# Patient Record
Sex: Female | Born: 1990
Health system: Southern US, Community
[De-identification: ages and names within clinical notes are randomized; demographics above are authoritative.]

## PROBLEM LIST (undated history)

## (undated) DIAGNOSIS — F3281 Premenstrual dysphoric disorder: Secondary | ICD-10-CM

## (undated) DIAGNOSIS — L732 Hidradenitis suppurativa: Secondary | ICD-10-CM

## (undated) DIAGNOSIS — G4489 Other headache syndrome: Secondary | ICD-10-CM

## (undated) HISTORY — PX: PILONIDAL CYST EXCISION: SHX744

---

## 2015-02-12 ENCOUNTER — Encounter (HOSPITAL_COMMUNITY): Payer: Self-pay | Admitting: Emergency Medicine

## 2015-02-12 ENCOUNTER — Emergency Department (HOSPITAL_COMMUNITY)
Admission: EM | Admit: 2015-02-12 | Discharge: 2015-02-12 | Disposition: A | Discharge status: Home / Self Care | Payer: Self-pay | Attending: Emergency Medicine | Admitting: Emergency Medicine

## 2015-02-12 DIAGNOSIS — Z720 Tobacco use: Secondary | ICD-10-CM | POA: Insufficient documentation

## 2015-02-12 DIAGNOSIS — J029 Acute pharyngitis, unspecified: Secondary | ICD-10-CM | POA: Insufficient documentation

## 2015-02-12 LAB — RAPID STREP SCREEN (MED CTR MEBANE ONLY): Streptococcus, Group A Screen (Direct): NEGATIVE

## 2015-02-12 MED ORDER — DEXAMETHASONE SODIUM PHOSPHATE 10 MG/ML IJ SOLN
10.0000 mg | Freq: Once | INTRAMUSCULAR | Status: AC
  Administered 2015-02-12: 10 mg via INTRAMUSCULAR
  Filled 2015-02-12: qty 1

## 2015-02-12 NOTE — ED Notes (Signed)
Patient states started having sore throat x 3 days ago.   Patient denies other symptoms.

## 2015-02-12 NOTE — ED Notes (Signed)
Pt waiting required observation time required after injection.

## 2015-02-12 NOTE — ED Provider Notes (Signed)
CSN: [id number]     Arrival date & time 02/12/15  1010 History  This chart was scribed for non-physician practitioner Jaynie Crumbleatyana Atsushi Yom, PA-C working with Purvis SheffieldForrest Harrison, MD by Littie Deedsichard Sun, ED Scribe. This patient was seen in room TR07C/TR07C and the patient's care was started at 11:11 AM.       Chief Complaint  Patient presents with  . Sore Throat   The history is provided by the patient. No language interpreter was used.    HPI Comments: Melanie Townsend is a 24 y.o. female who presents to the Emergency Department complaining of gradual onset sore throat that started 2-3 days ago. Patient also reports having had subjective fever, chills, and fatigue, did not check her temp at home; she states these have resolved. She thinks she may have strep throat. She has taken cough drops, saltwater gargles, sore throat spray, and ibuprofen but without relief. Today, she has taken ibuprofen. Patient denies cough and rhinorrhea.    History reviewed. No pertinent past medical history. History reviewed. No pertinent past surgical history. No family history on file. History  Substance Use Topics  . Smoking status: Current Every Day Smoker -- 0.30 packs/day    Types: Cigarettes  . Smokeless tobacco: Not on file  . Alcohol Use: Yes   OB History    No data available     Review of Systems  Constitutional: Positive for fever, chills and fatigue.  HENT: Positive for sore throat. Negative for rhinorrhea.   Respiratory: Negative for cough.       Allergies  Iodine  Home Medications   Prior to Admission medications   Not on File   BP 124/86 mmHg  Pulse 91  Temp(Src) 98.6 F (37 C) (Oral)  Resp 20  Ht 5\' 5"  (1.651 m)  Wt 209 lb (94.802 kg)  BMI 34.78 kg/m2  SpO2 100%  LMP 02/12/2015 Physical Exam  Constitutional: She is oriented to person, place, and time. She appears well-developed and well-nourished. No distress.  HENT:  Head: Normocephalic and atraumatic.  Mouth/Throat: Oropharynx  is clear and moist. No oropharyngeal exudate.  Bilaterally enlarged tonsils, no exudate. Uvula is midline. No trismus.  Eyes: Pupils are equal, round, and reactive to light.  Neck: Neck supple.  Cardiovascular: Normal rate, regular rhythm, normal heart sounds and intact distal pulses.   Pulmonary/Chest: Effort normal and breath sounds normal. No respiratory distress. She has no wheezes. She has no rales.  Musculoskeletal: She exhibits no edema.  Lymphadenopathy:    She has no cervical adenopathy.  Neurological: She is alert and oriented to person, place, and time. No cranial nerve deficit.  Skin: Skin is warm and dry. No rash noted.  Psychiatric: She has a normal mood and affect. Her behavior is normal.  Nursing note and vitals reviewed.   ED Course  Procedures  DIAGNOSTIC STUDIES: Oxygen Saturation is 100% on room air, normal by my interpretation.    COORDINATION OF CARE: 11:14 AM-Discussed treatment plan which includes rapid strep screen and strep culture with patient/guardian at bedside and patient/guardian agreed to plan.    Labs Review Labs Reviewed  RAPID STREP SCREEN (NOT [facility])  CULTURE, GROUP A STREP    Imaging Review No results found.   EKG Interpretation None      MDM   Final diagnoses:  Pharyngitis    patient with bilaterally enlarged and painful tonsils. No exudate. He is afebrile. Nontoxic appearing. No difficulty swallowing. Strep screen is negative. Decadron 10 mg IM for swelling. Home  with Tylenol/Motrin, saltwater gargles, Chloraseptic Spray. Most likely viral pharyngitis. Strep cultures are sent and she will be contacted if they return abnormal. Patient instructed to follow-up with primary care doctor or return if worsening  Filed Vitals:   02/12/15 1017 02/12/15 1127  BP: 124/86 117/80  Pulse: 91 77  Temp: 98.6 F (37 C) 98.7 F (37.1 C)  TempSrc: Oral Oral  Resp: 20 16  Height:  (1.651 m)   Weight: 209 lb (94.802 kg)   SpO2: 100%  100%    I personally performed the services described in this documentation, which was scribed in my presence. The recorded information has been reviewed and is accurate.   Jaynie Crumble, PA-C 02/12/15 1130  Jaynie Crumble, PA-C 02/12/15 1130  Purvis Sheffield, MD 02/12/15 1540

## 2015-02-12 NOTE — Discharge Instructions (Signed)
Your strep screen is negative. We sent your swab for culture. If it comes back positive we will call you. Meanwhile take Tylenol or Motrin for pain. Saltwater gargles multiple times a day. Try Chloraseptic throat spray or lozenges. Follow-up as needed, return if worsening.  Pharyngitis Pharyngitis is redness, pain, and swelling (inflammation) of your pharynx.  CAUSES  Pharyngitis is usually caused by infection. Most of the time, these infections are from viruses (viral) and are part of a cold. However, sometimes pharyngitis is caused by bacteria (bacterial). Pharyngitis can also be caused by allergies. Viral pharyngitis may be spread from person to person by coughing, sneezing, and personal items or utensils (cups, forks, spoons, toothbrushes). Bacterial pharyngitis may be spread from person to person by more intimate contact, such as kissing.  SIGNS AND SYMPTOMS  Symptoms of pharyngitis include:   Sore throat.   Tiredness (fatigue).   Low-grade fever.   Headache.  Joint pain and muscle aches.  Skin rashes.  Swollen lymph nodes.  Plaque-like film on throat or tonsils (often seen with bacterial pharyngitis). DIAGNOSIS  Your health care provider will ask you questions about your illness and your symptoms. Your medical history, along with a physical exam, is often all that is needed to diagnose pharyngitis. Sometimes, a rapid strep test is done. Other lab tests may also be done, depending on the suspected cause.  TREATMENT  Viral pharyngitis will usually get better in 3-4 days without the use of medicine. Bacterial pharyngitis is treated with medicines that kill germs (antibiotics).  HOME CARE INSTRUCTIONS   Drink enough water and fluids to keep your urine clear or pale yellow.   Only take over-the-counter or prescription medicines as directed by your health care provider:   If you are prescribed antibiotics, make sure you finish them even if you start to feel better.   Do not  take aspirin.   Get lots of rest.   Gargle with 8 oz of salt water ( tsp of salt per 1 qt of water) as often as every 1-2 hours to soothe your throat.   Throat lozenges (if you are not at risk for choking) or sprays may be used to soothe your throat. SEEK MEDICAL CARE IF:   You have large, tender lumps in your neck.  You have a rash.  You cough up green, yellow-brown, or bloody spit. SEEK IMMEDIATE MEDICAL CARE IF:   Your neck becomes stiff.  You drool or are unable to swallow liquids.  You vomit or are unable to keep medicines or liquids down.  You have severe pain that does not go away with the use of recommended medicines.  You have trouble breathing (not caused by a stuffy nose). MAKE SURE YOU:   Understand these instructions.  Will watch your condition.  Will get help right away if you are not doing well or get worse. Document Released: 08/01/2005 Document Revised: 05/22/2013 Document Reviewed: 04/08/2013 [facility] Patient Information 2015 [employer] [place]. This information is not intended to replace advice given to you by your health care provider. Make sure you discuss any questions you have with your health care provider.

## 2015-02-14 LAB — CULTURE, GROUP A STREP

## 2015-10-21 ENCOUNTER — Encounter (HOSPITAL_COMMUNITY): Payer: Self-pay | Admitting: *Deleted

## 2015-10-21 ENCOUNTER — Emergency Department (HOSPITAL_COMMUNITY)
Admission: EM | Admit: 2015-10-21 | Discharge: 2015-10-21 | Disposition: A | Discharge status: Home / Self Care | Payer: Self-pay | Attending: Emergency Medicine | Admitting: Emergency Medicine

## 2015-10-21 DIAGNOSIS — F17210 Nicotine dependence, cigarettes, uncomplicated: Secondary | ICD-10-CM | POA: Insufficient documentation

## 2015-10-21 DIAGNOSIS — L03111 Cellulitis of right axilla: Secondary | ICD-10-CM | POA: Insufficient documentation

## 2015-10-21 DIAGNOSIS — L539 Erythematous condition, unspecified: Secondary | ICD-10-CM | POA: Insufficient documentation

## 2015-10-21 MED ORDER — CLINDAMYCIN HCL 150 MG PO CAPS: 450.0000 mg | ORAL_CAPSULE | Freq: Three times a day (TID) | ORAL | Status: DC

## 2015-10-21 NOTE — ED Provider Notes (Signed)
CSN: [id number]     Arrival date & time 10/21/15  1500 History   By signing my name below, I, Iona Beard, attest that this documentation has been prepared under the direction and in the presence of Cheri Fowler, PA-C. Electronically Signed: Iona Beard, ED Scribe 10/21/2015 at 4:32 PM.    Chief Complaint  Patient presents with  . Recurrent Skin Infections    The history is provided by the patient. No language interpreter was used.   HPI Comments: Melanie Townsend is a 25 y.o. female who presents to the Emergency Department complaining of gradual onset, constant boil located in her right underarm, onset about a week ago. Pt states that she has had boils in the area before but they usually alleviate quickly. Pt has used warm compress and OTC boil ointment at home with no relief to symptoms. No other worsening or alleviating factors noted. Pt denies purulent drainage, fever, chills, nausea, vomiting, or any other pertinent symptoms.   History reviewed. No pertinent past medical history. History reviewed. No pertinent past surgical history. No family history on file. Social History  Substance Use Topics  . Smoking status: Current Every Day Smoker -- 0.30 packs/day    Types: Cigarettes  . Smokeless tobacco: None  . Alcohol Use: Yes   OB History    No data available     Review of Systems  Constitutional: Negative for fever and chills.  Gastrointestinal: Negative for nausea and vomiting.  Skin:       Boil noted in right underarm area.   All other systems reviewed and are negative.    Allergies  Iodine  Home Medications   Prior to Admission medications   Medication Sig Start Date End Date Taking? Authorizing Provider  clindamycin (CLEOCIN) 150 MG capsule Take 3 capsules (450 mg total) by mouth 3 (three) times daily. 10/21/15   Madia Carvell, PA-C   BP 119/76 mmHg  Pulse 85  Temp(Src) 98.1 F (36.7 C) (Oral)  Resp 16  SpO2 100%  LMP 09/26/2015 Physical Exam   Constitutional: She is oriented to person, place, and time. She appears well-developed and well-nourished.  HENT:  Head: Normocephalic and atraumatic.  Right Ear: External ear normal.  Left Ear: External ear normal.  Eyes: Conjunctivae are normal. No scleral icterus.  Neck: No tracheal deviation present.  Pulmonary/Chest: Effort normal. No respiratory distress.  Abdominal: She exhibits no distension.  Musculoskeletal: Normal range of motion.  Neurological: She is alert and oriented to person, place, and time.  Skin: Skin is warm and dry.  1x1 cm area of induration with overlying erythema along sinus tract of right axilla.  No fluctuance.  No drainage.  Psychiatric: She has a normal mood and affect. Her behavior is normal.    ED Course  Procedures (including critical care time) DIAGNOSTIC STUDIES: Oxygen Saturation is 100% on RA, normal by my interpretation.    COORDINATION OF CARE: 4:31 PM-Discussed treatment plan which includes symptom monitoring, ibuprofen for pain, and clindamycin with pt at bedside and pt agreed to plan.    Labs Review Labs Reviewed - No data to display  Imaging Review No results found.    EKG Interpretation None      MDM   Final diagnoses:  Cellulitis of right axilla   VSS, NAD.  No systemic symptoms.  Findings consistent with cellulitis.  No indication for I&D. Plan to discharge home with clindamycin.  Recommend tylenol or ibuprofen for pain.  Continue warm compresses. Patient given general surgery follow  up for possible removal of recurrent sinus tract infections.  Discussed return precautions including increase in size, warmth, spreading erythema, fever, chills, nausea, or vomiting.  Patient agrees and acknowledges the above plan for discharge.   I personally performed the services described in this documentation, which was scribed in my presence. The recorded information has been reviewed and is accurate.      Cheri FowlerKayla Bethannie Iglehart, PA-C 10/21/15  1639  Zadie Rhineonald Wickline, MD 10/22/15 [phone]

## 2015-10-21 NOTE — ED Notes (Signed)
Pt reports boil under rt arm. States that it has been there for several days. Pt reports hx of same.

## 2015-10-21 NOTE — ED Notes (Signed)
Patient able to ambulate independently  

## 2015-10-21 NOTE — Discharge Instructions (Signed)

## 2015-10-21 NOTE — ED Notes (Signed)
Patient able to ambulate independently to room 

## 2016-05-25 ENCOUNTER — Emergency Department (HOSPITAL_COMMUNITY)
Admission: EM | Admit: 2016-05-25 | Discharge: 2016-05-25 | Disposition: A | Discharge status: Home / Self Care | Payer: Self-pay | Attending: Emergency Medicine | Admitting: Emergency Medicine

## 2016-05-25 ENCOUNTER — Encounter (HOSPITAL_COMMUNITY): Payer: Self-pay | Admitting: *Deleted

## 2016-05-25 DIAGNOSIS — L0291 Cutaneous abscess, unspecified: Secondary | ICD-10-CM

## 2016-05-25 DIAGNOSIS — F17210 Nicotine dependence, cigarettes, uncomplicated: Secondary | ICD-10-CM | POA: Insufficient documentation

## 2016-05-25 DIAGNOSIS — L02411 Cutaneous abscess of right axilla: Secondary | ICD-10-CM | POA: Insufficient documentation

## 2016-05-25 DIAGNOSIS — L02412 Cutaneous abscess of left axilla: Secondary | ICD-10-CM | POA: Insufficient documentation

## 2016-05-25 MED ORDER — DOXYCYCLINE HYCLATE 100 MG PO CAPS: 100.0000 mg | ORAL_CAPSULE | Freq: Two times a day (BID) | ORAL | 0 refills | Status: DC

## 2016-05-25 MED ORDER — LIDOCAINE HCL (PF) 1 % IJ SOLN
30.0000 mL | Freq: Once | INTRAMUSCULAR | Status: AC
  Administered 2016-05-25: 30 mL via INTRADERMAL
  Filled 2016-05-25: qty 30

## 2016-05-25 NOTE — ED Provider Notes (Signed)
[facility]-EMERGENCY DEPT Provider Note   CSN: [id number] Arrival date & time: 05/25/16  1036  By signing my name below, I, Freida Busman, attest that this documentation has been prepared under the direction and in the presence of non-physician practitioner, Teressa Lower, NP. Electronically Signed: Freida Busman, Scribe. 05/25/2016. 10:58 AM.   History   Chief Complaint Chief Complaint  Patient presents with  . Abscess    The history is provided by the patient. No language interpreter was used.    HPI Comments:  Melanie Townsend is a 25 y.o. female who presents to the Emergency Department complaining of painful boils to her bilateral axilla x ~ 1 week. She notes she has a h/o same and states they are usually resolved with warm compresses. Pt admits she uses razors to shave her underarms. No alleviating factors noted. Pt has no other complaints or symptoms at this time.    History reviewed. No pertinent past medical history.  There are no active problems to display for this patient.   History reviewed. No pertinent surgical history.  OB History    No data available       Home Medications    Prior to Admission medications   Medication Sig Start Date End Date Taking? Authorizing Provider  clindamycin (CLEOCIN) 150 MG capsule Take 3 capsules (450 mg total) by mouth 3 (three) times daily. 10/21/15   Cheri Fowler, PA-C    Family History History reviewed. No pertinent family history.  Social History Social History  Substance Use Topics  . Smoking status: Current Every Day Smoker    Packs/day: 0.30    Types: Cigarettes  . Smokeless tobacco: Not on file  . Alcohol use Yes     Allergies   Iodine   Review of Systems Review of Systems  Constitutional: Negative for chills and fever.  Skin: Positive for rash.    Physical Exam Updated Vital Signs BP 116/83 (BP Location: Left Arm)   Pulse 94   Temp 98.3 F (36.8 C) (Oral)   Resp 21   Ht 5\' 6"  (1.676 m)   Wt 231 lb  6 oz (105 kg)   LMP 05/15/2016   SpO2 100%   BMI 37.34 kg/m   Physical Exam  Constitutional: She is oriented to person, place, and time. She appears well-developed and well-nourished. No distress.  HENT:  Head: Normocephalic and atraumatic.  Eyes: Conjunctivae are normal.  Cardiovascular: Normal rate.   Pulmonary/Chest: Effort normal.  Abdominal: She exhibits no distension.  Neurological: She is alert and oriented to person, place, and time.  Skin: Skin is warm and dry.  fluctuant erythematous area to the left axilla. No drainage noted. Pt tender in the right axilla. No redness or warmth noted  Psychiatric: She has a normal mood and affect.  Nursing note and vitals reviewed.    ED Treatments / Results  DIAGNOSTIC STUDIES:  Oxygen Saturation is 100% on RA, normal by my interpretation.    COORDINATION OF CARE:  10:57 AM Discussed treatment plan with pt at bedside and pt agreed to plan.  Labs (all labs ordered are listed, but only abnormal results are displayed) Labs Reviewed - No data to display  EKG  EKG Interpretation None       Radiology No results found.  Procedures Procedures (including critical care time)  INCISION AND DRAINAGE PROCEDURE NOTE: Patient identification was confirmed and verbal consent was obtained. This procedure was performed by Teressa Lower, NP at 10:57 AM. Site: right axila Sterile procedures observed  Needle size: 25 gauge  Anesthetic used (type and amt): lido with epi Blade size: 11 Drainage: scant Complexity: Complex Site anesthetized, incision made over site, wound drained and explored loculations, rinsed with copious amounts of normal saline, wound packed with sterile gauze, covered with dry, sterile dressing.  Pt tolerated procedure well without complications.  Instructions for care discussed verbally and pt provided with additional written instructions for homecare and f/u.   Medications Ordered in ED Medications    lidocaine (PF) (XYLOCAINE) 1 % injection 30 mL (30 mLs Intradermal Given by Other 05/25/16 1057)     Initial Impression / Assessment and Plan / ED Course  I have reviewed the triage vital signs and the nursing notes.  Pertinent labs & imaging results that were available during my care of the patient were reviewed by me and considered in my medical decision making (see chart for details).  Clinical Course   Area drained. Will treat with antibiotics   Patient with skin abscess. Incision and drainage performed in the ED today.  Abscess was not large enough to warrant packing or drain placement. Wound recheck in 2 days. Supportive care and return precautions discussed.  Pt sent home with antibiotics. The patient appears reasonably screened and/or stabilized for discharge and I doubt any other emergent medical condition requiring further screening, evaluation, or treatment in the ED prior to discharge.   Final Clinical Impressions(s) / ED Diagnoses   Final diagnoses:  None    New Prescriptions New Prescriptions   No medications on file   I personally performed the services described in this documentation, which was scribed in my presence. The recorded information has been reviewed and is accurate.     Teressa LowerVrinda Dameir Gentzler, NP 05/25/16 1136    Shaune Pollackameron Isaacs, MD 05/25/16 1755

## 2016-05-25 NOTE — ED Notes (Signed)
C/O BILATERAL AXILLARY ABSCESSES. HX OF RECURRING ABSCESSES.

## 2016-05-25 NOTE — ED Triage Notes (Signed)
Pt has abscess under bilateral arms, denies fever. No acute distress noted at triage.

## 2016-11-15 ENCOUNTER — Emergency Department (HOSPITAL_COMMUNITY)
Admission: EM | Admit: 2016-11-15 | Discharge: 2016-11-15 | Disposition: A | Discharge status: Home / Self Care | Payer: Self-pay | Attending: Emergency Medicine | Admitting: Emergency Medicine

## 2016-11-15 ENCOUNTER — Encounter (HOSPITAL_COMMUNITY): Payer: Self-pay

## 2016-11-15 DIAGNOSIS — F17210 Nicotine dependence, cigarettes, uncomplicated: Secondary | ICD-10-CM | POA: Insufficient documentation

## 2016-11-15 DIAGNOSIS — L0291 Cutaneous abscess, unspecified: Secondary | ICD-10-CM

## 2016-11-15 DIAGNOSIS — L02412 Cutaneous abscess of left axilla: Secondary | ICD-10-CM | POA: Insufficient documentation

## 2016-11-15 MED ORDER — CEPHALEXIN 500 MG PO CAPS: 500.0000 mg | ORAL_CAPSULE | Freq: Two times a day (BID) | ORAL | 0 refills | Status: DC

## 2016-11-15 MED ORDER — LIDOCAINE-EPINEPHRINE (PF) 2 %-1:200000 IJ SOLN
10.0000 mL | Freq: Once | INTRAMUSCULAR | Status: AC
  Administered 2016-11-15: 10 mL via INTRADERMAL
  Filled 2016-11-15: qty 20

## 2016-11-15 MED ORDER — HYDROCODONE-ACETAMINOPHEN 5-325 MG PO TABS: 1.0000 | ORAL_TABLET | ORAL | 0 refills | Status: DC | PRN

## 2016-11-15 MED ORDER — IBUPROFEN 400 MG PO TABS
600.0000 mg | ORAL_TABLET | Freq: Once | ORAL | Status: AC
  Administered 2016-11-15: 600 mg via ORAL
  Filled 2016-11-15: qty 1

## 2016-11-15 NOTE — Discharge Instructions (Signed)
Take pain medication as directed for symptomatic relief.   After you are done taking the pain medication you can take ibuprofen as needed for symptomatic relief.   If the drain falls out, that is ok.  Apply warm compresses to arm pit throughout the day. Take antibiotic and completion. Follow-up with [facility] in 3 days for wound recheck and packing removal.  Have her return to emergency department for emergent changing or wRedge Gainerg symptoms.  Return to the emergency dept for any worsening redness, swelling, fevers, or any other concerns.    If you do not have a primary care doctor you see regularly, please you the list below. Please call them to arrange for follow-up.    No Primary Care Doctor Call [facility]  [phone] Other agencies that provide inexpensive medical care    [facility]  [phone]    [facility]  [phone]    [facility]  [phone]    [facility]  [phone]    [facility]  [phone]    [facility]  [phone]

## 2016-11-15 NOTE — ED Triage Notes (Signed)
Patient here with abscess to left axilla, history of same, no drainage

## 2016-11-15 NOTE — ED Provider Notes (Signed)
[facility]Y DEPT Provider Note   CSN: [id number] Arrival date & time: 11/15/16  1120  By signing my name below, I, Melanie Townsend, attest that this documentation has been prepared under the direction and in the presence of [name], [place]. Electronically Signed: Teofilo Townsend, ED Scribe. 11/15/2016. 2:14 PM.    History   Chief Complaint No chief complaint on file.  The history is provided by the patient. No language interpreter was used.  HPI Comments: Melanie Townsend is a 26 y.o. female who presents to the Emergency Department complaining of a moderate, gradually worsening area of pain and swelling consistent with an abscess to the left axilla x 2 days. Pt states pain is exacerbated with palpation and direct pressure. She has taken ibuprofen for pain with minimal relief. She reports a history of previous similar abscesses that have had to be previously drained. Pt reports denies drainage from the area. Pt denies fevers, chills, chest pain, SOB, nausea, vomiting.   History reviewed. No pertinent past medical history.  There are no active problems to display for this patient.   History reviewed. No pertinent surgical history.  OB History    No data available       Home Medications    Prior to Admission medications   Medication Sig Start Date End Date Taking? Authorizing Provider  ibuprofen (ADVIL,MOTRIN) 200 MG tablet Take 200-800 mg by mouth every 6 (six) hours as needed for mild pain.   Yes Historical Provider, MD  cephALEXin (KEFLEX) 500 MG capsule Take 1 capsule (500 mg total) by mouth 2 (two) times daily. 11/15/16   Maxwell Caul, PA-C  HYDROcodone-acetaminophen (NORCO/VICODIN) 5-325 MG tablet Take 1 tablet by mouth every 4 (four) hours as needed. 11/15/16   Maxwell Caul, PA-C    Family History No family history on file.  Social History Social History  Substance Use Topics  . Smoking status: Current Every Day Smoker    Packs/day: 0.30    Types:  Cigarettes  . Smokeless tobacco: Not on file  . Alcohol use Yes     Allergies   Iodine   Review of Systems Review of Systems  Constitutional: Negative for fever.  Respiratory: Negative for cough and shortness of breath.   Cardiovascular: Negative for chest pain.  Gastrointestinal: Negative for abdominal pain, constipation, diarrhea, nausea and vomiting.  Genitourinary: Negative for dysuria and hematuria.  Skin:       Positive for abscess.  Neurological: Negative for headaches.  All other systems reviewed and are negative.    Physical Exam Updated Vital Signs BP 107/65 (BP Location: Right Arm)   Pulse 73   Temp 98.8 F (37.1 C)   Resp 18   SpO2 100%   Physical Exam  Constitutional: She appears well-developed and well-nourished.  HENT:  Head: Normocephalic and atraumatic.  Eyes: Conjunctivae and EOM are normal. Right eye exhibits no discharge. Left eye exhibits no discharge. No scleral icterus.  Pulmonary/Chest: Effort normal.  Musculoskeletal: She exhibits no deformity.  Neurological: She is alert.  Skin: Skin is warm and dry.  2cm area of fluctuance on anterior aspect of left axilla. No surrounding erythema, warmth, or tenderness.   Psychiatric: She has a normal mood and affect. Her speech is normal and behavior is normal.     ED Treatments / Results  DIAGNOSTIC STUDIES:  Oxygen Saturation is 100% on RA, normal by my interpretation.    COORDINATION OF CARE:    Labs (all labs ordered are listed, but  only abnormal results are displayed) Labs Reviewed - No data to display  EKG  EKG Interpretation None       Radiology No results found.  Procedures .Marland KitchenIncision and Drainage Date/Time: 11/15/2016 3:11 PM Performed by: [name] A Authorized by: Margarita Grizzle   Consent:    Consent obtained:  Verbal   Consent given by:  Patient Location:    Type:  Abscess   Location:  Upper extremity   Upper extremity location: left axilla. Pre-procedure  details:    Procedure prep: Alcohol due to patients allergy to iodine. Anesthesia (see MAR for exact dosages):    Anesthesia method:  Local infiltration   Local anesthetic:  Lidocaine 2% WITH epi Procedure type:    Complexity:  Simple Procedure details:    Incision types:  Single straight   Scalpel blade:  11   Wound management:  Probed and deloculated   Drainage:  Purulent   Drainage amount:  Moderate   Wound treatment:  Drain placed   Packing materials:  1/4 in iodoform gauze Post-procedure details:    Patient tolerance of procedure:  Tolerated well, no immediate complications      Medications Ordered in ED Medications  lidocaine-EPINEPHrine (XYLOCAINE W/EPI) 2 %-1:200000 (PF) injection 10 mL (10 mLs Intradermal Given 11/15/16 1502)  ibuprofen (ADVIL,MOTRIN) tablet 600 mg (600 mg Oral Given 11/15/16 1501)     Initial Impression / Assessment and Plan / ED Course  I have reviewed the triage vital signs and the nursing notes.  Pertinent labs & imaging results that were available during my care of the patient were reviewed by me and considered in my medical decision making (see chart for details).     26 y.o. F presents with abscess to her left axilla. Has a history of previous abscesses in various locations that have required I&D. Physical exam with a 2cm in diameter abscess to the left axilla with surrounding fluctuance. Plan to I&D. Discussed plan with patient and she is in agreement.   I&D performed with moderate purulent drainage. Wick drain left in place. Discussed proper wound management with patient. Instructed her to return to the ED in 3 days for wound recheck or sooner if she experiences worsening concerns or symptoms. Patient provided a short course of Norco for pain relief and short course of antibiotic. Return precautions discussed. Patient expressed understanding andd agreement to plan.   Final Clinical Impressions(s) / ED Diagnoses   Final diagnoses:  Abscess     New Prescriptions Discharge Medication List as of 11/15/2016  3:29 PM    START taking these medications   Details  cephALEXin (KEFLEX) 500 MG capsule Take 1 capsule (500 mg total) by mouth 2 (two) times daily., Starting Tue 11/15/2016, Print    HYDROcodone-acetaminophen (NORCO/VICODIN) 5-325 MG tablet Take 1 tablet by mouth every 4 (four) hours as needed., Starting Tue 11/15/2016, Print      I personally performed the services described in this documentation, which was scribed in my presence. The recorded information has been reviewed and is accurate.     Maxwell Caul, PA-C 11/15/16 1610    Margarita Grizzle, MD 11/19/16 1726

## 2016-11-19 ENCOUNTER — Encounter (HOSPITAL_COMMUNITY): Payer: Self-pay | Admitting: Emergency Medicine

## 2016-11-19 ENCOUNTER — Emergency Department (HOSPITAL_COMMUNITY)
Admission: EM | Admit: 2016-11-19 | Discharge: 2016-11-19 | Disposition: A | Discharge status: Home / Self Care | Payer: Self-pay | Attending: Emergency Medicine | Admitting: Emergency Medicine

## 2016-11-19 DIAGNOSIS — F17210 Nicotine dependence, cigarettes, uncomplicated: Secondary | ICD-10-CM | POA: Insufficient documentation

## 2016-11-19 DIAGNOSIS — Z4801 Encounter for change or removal of surgical wound dressing: Secondary | ICD-10-CM | POA: Insufficient documentation

## 2016-11-19 DIAGNOSIS — Z09 Encounter for follow-up examination after completed treatment for conditions other than malignant neoplasm: Secondary | ICD-10-CM

## 2016-11-19 NOTE — ED Notes (Signed)
Declined W/C at D/C and was escorted to lobby by RN. 

## 2016-11-19 NOTE — ED Provider Notes (Signed)
[facility] Provider Note   CSN: [id number] Arrival date & time: 11/19/16  6962  By signing my name below, I, Freida Busman, attest that this documentation has been prepared under the direction and in the presence of General Wearing, PA-C. Electronically Signed: Freida Busman, Scribe. 11/19/2016. 10:43 AM.  History   Chief Complaint Chief Complaint  Patient presents with  . Wound Check    The history is provided by the patient and medical records. No language interpreter was used.    HPI Comments:  Melanie Townsend is a 26 y.o. female with a h/o of recurrent abscesses presents to the Emergency Department for a wound check. About one week ago she first noticed a painful boil to the left axilla. She states some go away on their own, some resolve with warm compresses and some require I&D. She also has a h/o pilonidal cyst that required surgical intervention.    Pt was seen in the ED on 11/15/16 for the abscess to the left axilla. An incision and drainage was performed and packing was placed.  She was discharged with Rx for Keflex and Vicodin. Pt has not taken the Keflex because she is currently taking Metronidazole for BV and was concerned about interactions. She also states she has not taken the Vicodin either. Pt was instructed to return in 3 days for a wound check. She reports discomfort to the site but states pain has improved significantly since discharge. She also notes subjective fever yesterday that resolved after taking Tylenol.   History reviewed. No pertinent past medical history.  There are no active problems to display for this patient.   No past surgical history on file.  OB History    No data available       Home Medications    Prior to Admission medications   Medication Sig Start Date End Date Taking? Authorizing Provider  cephALEXin (KEFLEX) 500 MG capsule Take 1 capsule (500 mg total) by mouth 2 (two) times daily. 11/15/16   Maxwell Caul, PA-C    HYDROcodone-acetaminophen (NORCO/VICODIN) 5-325 MG tablet Take 1 tablet by mouth every 4 (four) hours as needed. 11/15/16   Maxwell Caul, PA-C  ibuprofen (ADVIL,MOTRIN) 200 MG tablet Take 200-800 mg by mouth every 6 (six) hours as needed for mild pain.    Historical Provider, MD    Family History No family history on file.  Social History Social History  Substance Use Topics  . Smoking status: Current Every Day Smoker    Packs/day: 0.30    Types: Cigarettes  . Smokeless tobacco: Not on file  . Alcohol use Yes     Allergies   Iodine   Review of Systems Review of Systems  Constitutional: Positive for fever (subjective; resolved). Negative for activity change.  Respiratory: Negative for shortness of breath.   Cardiovascular: Negative for chest pain.  Gastrointestinal: Negative for abdominal pain.  Musculoskeletal: Negative for back pain.  Skin: Positive for wound. Negative for rash.   Physical Exam Updated Vital Signs BP 114/90   Pulse 86   Temp 98.9 F (37.2 C)   Resp 19   LMP 11/07/2016   SpO2 100%   Physical Exam  Constitutional: She appears well-developed and well-nourished. No distress.  HENT:  Head: Normocephalic and atraumatic.  Eyes: Conjunctivae are normal.  Neck: Neck supple.  Cardiovascular: Normal rate, regular rhythm and normal heart sounds.  Exam reveals no gallop and no friction rub.   No murmur heard. Pulmonary/Chest: Effort normal and breath sounds normal.  No respiratory distress. She has no wheezes. She has no rales.  Abdominal: Soft. She exhibits no distension. There is no tenderness. There is no guarding.  Musculoskeletal: She exhibits no edema.  Neurological: She is alert.  Skin: Skin is warm and dry. No rash noted. She is not diaphoretic.  Left axilla: Well-healing abscess without surrounding cellulitis. No active purulent draining from I&D site after packing was removed. Minimal purulent drainage expressed with palpation. No palpable  fluctuance.   Psychiatric: Her behavior is normal.  Nursing note and vitals reviewed.  ED Treatments / Results  DIAGNOSTIC STUDIES:  Oxygen Saturation is 100% on RA, normal by my interpretation.    COORDINATION OF CARE:  10:40 AM Discussed treatment plan with pt at bedside and pt agreed to plan.  Labs (all labs ordered are listed, but only abnormal results are displayed) Labs Reviewed - No data to display  EKG  EKG Interpretation None       Radiology No results found.  Procedures Procedures (including critical care time)  Medications Ordered in ED Medications - No data to display   Initial Impression / Assessment and Plan / ED Course  I have reviewed the triage vital signs and the nursing notes.  Pertinent labs & imaging results that were available during my care of the patient were reviewed by me and considered in my medical decision making (see chart for details).    Pt with abscess seen four days ago with I&D. Chart reviewed in [facility]. The site is healthy appearing, with only a small amount of purulent drainage around the skin but incision is open and without significant drainage. No surrounding cellulitis and no palpable fluctuance or induration indicating deeper infection or residual abscess. Pt reports continued pain due to the location of the abscess near of bra line, but reports significant improvement. Patient reports she has not taken the Keflex prescribed from her previous ED visit because she was concerned about an interaction with the metronidazole she was currently taking for BV. Discussed that there was no drug interaction between the two. Discussed and evaluated the patient with Dr. Donnald Garre, attending physician. No need for antibiotic therapy at this time since the wound is well-healing. The patient reports she has not been established with a surgery for recurrent abscesses. Will give the patient a referral. Discussed home care and return precautions.   Final  Clinical Impressions(s) / ED Diagnoses   Final diagnoses:  Encounter for recheck of abscess following incision and drainage    New Prescriptions Discharge Medication List as of 11/19/2016 11:29 AM     I personally performed the services described in this documentation, which was scribed in my presence. The recorded information has been reviewed and is accurate.     Barkley Boards, PA-C 11/20/16 6295    Arby Barrette, MD 11/22/16 [phone]

## 2016-11-19 NOTE — Discharge Instructions (Signed)
Please clean the area with soap and water. Afterward, keep it dry and cover the area. The area is healing so you do not need to take antibiotics at this time. Please return to the Emergency Department for new or worsening symptoms.  Dr. Lindie Spruce is a general surgeon who you can call and schedule an appointment with after your visit today for ongoing care. You may also call the number on your discharge paperwork to get established with a primary care provider.

## 2016-11-19 NOTE — ED Triage Notes (Signed)
Pt reports she had an I&D Tuesday with packing placement. Pt is due for a wound re-check. Pt has not started her antibiotics for this yet because she was on another antidotic already for BV and she was worried they would interact. Pt sates pain to site but is not taking any medication for pain.

## 2017-02-07 ENCOUNTER — Encounter (HOSPITAL_COMMUNITY): Payer: Self-pay | Admitting: Emergency Medicine

## 2017-02-07 ENCOUNTER — Emergency Department (HOSPITAL_COMMUNITY)
Admission: EM | Admit: 2017-02-07 | Discharge: 2017-02-07 | Disposition: A | Discharge status: Home / Self Care | Payer: Self-pay | Attending: Emergency Medicine | Admitting: Emergency Medicine

## 2017-02-07 DIAGNOSIS — Z79899 Other long term (current) drug therapy: Secondary | ICD-10-CM | POA: Insufficient documentation

## 2017-02-07 DIAGNOSIS — F17210 Nicotine dependence, cigarettes, uncomplicated: Secondary | ICD-10-CM | POA: Insufficient documentation

## 2017-02-07 DIAGNOSIS — L02411 Cutaneous abscess of right axilla: Secondary | ICD-10-CM | POA: Insufficient documentation

## 2017-02-07 MED ORDER — SULFAMETHOXAZOLE-TRIMETHOPRIM 800-160 MG PO TABS: 1.0000 | ORAL_TABLET | Freq: Two times a day (BID) | ORAL | 0 refills | Status: AC

## 2017-02-07 MED ORDER — CLINDAMYCIN PHOSPHATE 1 % EX GEL: Freq: Two times a day (BID) | CUTANEOUS | 0 refills | Status: DC

## 2017-02-07 NOTE — Discharge Instructions (Signed)
Return if any problems.

## 2017-02-07 NOTE — ED Provider Notes (Signed)
[facility] Provider Note   CSN: [id number] Arrival date & time: 02/07/17  1259  By signing my name below, I, Doreatha MartinEva Mathews, attest that this documentation has been prepared under the direction and in the presence of Leslie K. Sofia, PA-C. Electronically Signed: Doreatha MartinEva Mathews, ED Scribe. 02/07/17. 3:01 PM.    History   Chief Complaint Chief Complaint  Patient presents with  . Abscess    HPI Melanie Townsend is a 26 y.o. female who presents to the Emergency Department complaining of moderate, gradually worsening areas of pain and swelling to bilateral axilla (R>L) onset a week ago. She reports frequent h/o similar abscesses, which occasionally require I&D (most recently 1 month ago). Pt states pain is worsened with palpation and direct pressure. She has tried warm compress, ibuprofen and draining the areas with minimal relief. She recently switched to spray deodorant with no improvement of the recurrent abscesses. She would like a dermatology referral. Denies fever, chills, drainage from the area.    The history is provided by the patient. No language interpreter was used.    History reviewed. No pertinent past medical history.  There are no active problems to display for this patient.   History reviewed. No pertinent surgical history.  OB History    No data available       Home Medications    Prior to Admission medications   Medication Sig Start Date End Date Taking? Authorizing Provider  cephALEXin (KEFLEX) 500 MG capsule Take 1 capsule (500 mg total) by mouth 2 (two) times daily. 11/15/16   Maxwell CaulLayden, Lindsey A, PA-C  HYDROcodone-acetaminophen (NORCO/VICODIN) 5-325 MG tablet Take 1 tablet by mouth every 4 (four) hours as needed. 11/15/16   Maxwell CaulLayden, Lindsey A, PA-C  ibuprofen (ADVIL,MOTRIN) 200 MG tablet Take 200-800 mg by mouth every 6 (six) hours as needed for mild pain.    [provider]    Family History History reviewed. No pertinent family  history.  Social History Social History  Substance Use Topics  . Smoking status: Current Every Day Smoker    Packs/day: 0.30    Types: Cigarettes  . Smokeless tobacco: Not on file  . Alcohol use Yes     Allergies   Iodine   Review of Systems Review of Systems  Constitutional: Negative for chills and fever.  Skin: Positive for wound.  All other systems reviewed and are negative.    Physical Exam Updated Vital Signs BP 128/88 (BP Location: Right Arm)   Pulse 77   Temp 99.3 F (37.4 C) (Oral)   Resp 18   SpO2 100%   Physical Exam  Constitutional: She appears well-developed and well-nourished.  HENT:  Head: Normocephalic.  Eyes: Conjunctivae are normal.  Cardiovascular: Normal rate.   Pulmonary/Chest: Effort normal. No respiratory distress.  Abdominal: She exhibits no distension.  Musculoskeletal: Normal range of motion.  Open oozing area right axilla. Two pea sized swollen areas to the left axilla.   Neurological: She is alert.  Skin: Skin is warm and dry.  Psychiatric: She has a normal mood and affect. Her behavior is normal.  Nursing note and vitals reviewed.    ED Treatments / Results   DIAGNOSTIC STUDIES: Oxygen Saturation is 100% on RA, normal by my interpretation.    COORDINATION OF CARE: 2:55 PM Discussed treatment plan with pt at bedside which includes dermatology referral, antibiotics and pt agreed to plan.   Procedures Procedures (including critical care time)  Medications Ordered in ED Medications - No data to display  Initial Impression / Assessment and Plan / ED Course  I have reviewed the triage vital signs and the nursing notes.    Patient with skin abscess. Incision and drainage not performed in the ED today. Supportive care and return precautions discussed.  Pt sent home with Clindamycin gel and Bactrim and provided with referral to dermatology. The patient appears reasonably screened and/or stabilized for discharge and I doubt any  other emergent medical condition requiring further screening, evaluation, or treatment in the ED prior to discharge.    Final Clinical Impressions(s) / ED Diagnoses   Final diagnoses:  Abscess of axilla, right    New Prescriptions New Prescriptions   CLINDAMYCIN (CLINDAGEL) 1 % GEL    Apply topically 2 (two) times daily.   SULFAMETHOXAZOLE-TRIMETHOPRIM (BACTRIM DS,SEPTRA DS) 800-160 MG TABLET    Take 1 tablet by mouth 2 (two) times daily.    An After Visit Summary was printed and given to the patient.  I personally performed the services in this documentation, which was scribed in my presence.  The recorded information has been reviewed and considered.   Barnet Pall.    Elson Areas, [place] 02/07/17 1536    Alvira Monday, MD 02/08/17 1428

## 2017-02-07 NOTE — ED Notes (Signed)
Pt has hx of recurring axillary abscesses. Now has draining wound right axilla with pain and swelling.

## 2017-02-07 NOTE — ED Triage Notes (Signed)
Pt here with abscess under right axillary area  

## 2017-07-03 ENCOUNTER — Encounter (HOSPITAL_COMMUNITY): Payer: Self-pay | Admitting: Emergency Medicine

## 2017-07-03 DIAGNOSIS — L02411 Cutaneous abscess of right axilla: Secondary | ICD-10-CM | POA: Insufficient documentation

## 2017-07-03 DIAGNOSIS — Z7722 Contact with and (suspected) exposure to environmental tobacco smoke (acute) (chronic): Secondary | ICD-10-CM | POA: Insufficient documentation

## 2017-07-03 NOTE — ED Triage Notes (Signed)
Pt reports abscess to R axilla X1 week. States hx of same

## 2017-07-04 ENCOUNTER — Emergency Department (HOSPITAL_COMMUNITY)
Admission: EM | Admit: 2017-07-04 | Discharge: 2017-07-04 | Disposition: A | Discharge status: Home / Self Care | Payer: Self-pay | Attending: Emergency Medicine | Admitting: Emergency Medicine

## 2017-07-04 DIAGNOSIS — L02411 Cutaneous abscess of right axilla: Secondary | ICD-10-CM

## 2017-07-04 MED ORDER — LIDOCAINE-EPINEPHRINE (PF) 2 %-1:200000 IJ SOLN
10.0000 mL | Freq: Once | INTRAMUSCULAR | Status: AC
  Administered 2017-07-04: 10 mL
  Filled 2017-07-04: qty 20

## 2017-07-04 MED ORDER — SULFAMETHOXAZOLE-TRIMETHOPRIM 800-160 MG PO TABS: 1.0000 | ORAL_TABLET | Freq: Two times a day (BID) | ORAL | 0 refills | Status: AC

## 2017-07-04 MED ORDER — IBUPROFEN 600 MG PO TABS: 600.0000 mg | ORAL_TABLET | Freq: Four times a day (QID) | ORAL | 0 refills | Status: DC | PRN

## 2017-07-04 NOTE — ED Provider Notes (Signed)
[facility] Provider Note   CSN: [id number] Arrival date & time: 07/03/17  2218     History   Chief Complaint Chief Complaint  Patient presents with  . Abscess    HPI Melanie Townsend is a 26 y.o. female.  The history is provided by the patient. No language interpreter was used.  Abscess  Abscess location: R axilla. Size:  1.5cm x 3.5cm Abscess quality: induration and painful   Abscess quality: not draining and not weeping   Red streaking: no   Duration:  5 days Progression:  Worsening Pain details:    Quality:  Sharp Chronicity:  Recurrent Relieved by:  Nothing Ineffective treatments:  Warm compresses Associated symptoms: no fever, no nausea and no vomiting   Risk factors: prior abscess     History reviewed. No pertinent past medical history.  There are no active problems to display for this patient.   History reviewed. No pertinent surgical history.  OB History    No data available       Home Medications    Prior to Admission medications   Medication Sig Start Date End Date Taking? Authorizing Provider  cephALEXin (KEFLEX) 500 MG capsule Take 1 capsule (500 mg total) by mouth 2 (two) times daily. 11/15/16   Maxwell CaulLayden, Lindsey A, PA-C  HYDROcodone-acetaminophen (NORCO/VICODIN) 5-325 MG tablet Take 1 tablet by mouth every 4 (four) hours as needed. 11/15/16   Maxwell CaulLayden, Lindsey A, PA-C  ibuprofen (ADVIL,MOTRIN) 600 MG tablet Take 1 tablet (600 mg total) every 6 (six) hours as needed by mouth. 07/04/17   Antony MaduraHumes, Avyanna Spada, PA-C  sulfamethoxazole-trimethoprim (BACTRIM DS,SEPTRA DS) 800-160 MG tablet Take 1 tablet 2 (two) times daily for 7 days by mouth. 07/04/17 07/11/17  Antony MaduraHumes, Mckinzee Spirito, PA-C    Family History No family history on file.  Social History Social History   Tobacco Use  . Smoking status: Current Every Day Smoker    Packs/day: 1.00    Types: Cigarettes  . Smokeless tobacco: Never Used  Substance Use Topics  .  Alcohol use: Yes  . Drug use: No     Allergies   Iodine   Review of Systems Review of Systems  Constitutional: Negative for fever.  Gastrointestinal: Negative for nausea and vomiting.  Ten systems reviewed and are negative for acute change, except as noted in the HPI.    Physical Exam Updated Vital Signs BP 124/79 (BP Location: Right Arm)   Pulse 93   Temp 99 F (37.2 C) (Oral)   Resp 16   Ht 5\' 6"  (1.676 m)   Wt 106.6 kg (235 lb)   LMP 06/27/2017   SpO2 99%   BMI 37.93 kg/m   Physical Exam  Constitutional: She is oriented to person, place, and time. She appears well-developed and well-nourished. No distress.  Nontoxic appearing and in NAD  HENT:  Head: Normocephalic and atraumatic.  Eyes: Conjunctivae and EOM are normal. No scleral icterus.  Neck: Normal range of motion.  Cardiovascular: Normal rate, regular rhythm and intact distal pulses.  Pulmonary/Chest: Effort normal. No stridor. No respiratory distress.  Respirations even and unlabored  Musculoskeletal: Normal range of motion.  Neurological: She is alert and oriented to person, place, and time. She exhibits normal muscle tone. Coordination normal.  GCS 15. Patient moving all extremities. Ambulatory with steady gait.  Skin: Skin is warm and dry. No rash noted. She is not diaphoretic. No erythema. No pallor.  Mild TTP to the right axilla with induration. No significant  overlying erythema or heat to touch. No red linear streaking.  Psychiatric: She has a normal mood and affect. Her behavior is normal.  Nursing note and vitals reviewed.    ED Treatments / Results  Labs (all labs ordered are listed, but only abnormal results are displayed) Labs Reviewed - No data to display  EKG  EKG Interpretation None       Radiology No results found.  Procedures Procedures (including critical care time) INCISION AND DRAINAGE Performed by: Antony MaduraHUMES, Joeangel Jeanpaul Consent: Verbal consent obtained. Risks and benefits:  risks, benefits and alternatives were discussed Type: abscess  Body area: R axilla  Anesthesia: local infiltration  Incision was made with a scalpel.  Local anesthetic: lidocaine 2% with epinephrine  Anesthetic total: 4 ml  Complexity: complex Blunt dissection to break up loculations  Drainage: bloody  Drainage amount: small  Packing material: none  Patient tolerance: Patient tolerated the procedure well with no immediate complications.    Medications Ordered in ED Medications  lidocaine-EPINEPHrine (XYLOCAINE W/EPI) 2 %-1:200000 (PF) injection 10 mL (10 mLs Infiltration Given 07/04/17 0245)     Initial Impression / Assessment and Plan / ED Course  I have reviewed the triage vital signs and the nursing notes.  Pertinent labs & imaging results that were available during my care of the patient were reviewed by me and considered in my medical decision making (see chart for details).     Patient with skin abscess amenable to incision and drainage.  Abscess was not large enough to warrant packing or drain, wound recheck in 2 days advised. Encouraged home warm soaks and flushing.  Mild signs of cellulitis is surrounding skin. Will d/c to home on Bactrim given low volume drainage. Return precautions discussed and provided. Patient discharged in stable condition with no unaddressed concerns.   Final Clinical Impressions(s) / ED Diagnoses   Final diagnoses:  Abscess of axilla, right    ED Discharge Orders        Ordered    sulfamethoxazole-trimethoprim (BACTRIM DS,SEPTRA DS) 800-160 MG tablet  2 times daily     07/04/17 0305    ibuprofen (ADVIL,MOTRIN) 600 MG tablet  Every 6 hours PRN     07/04/17 0305       Antony MaduraHumes, Alta Shober, PA-C 07/04/17 0310    Horton, Mayer Maskerourtney F, MD 07/04/17 [phone]

## 2017-07-04 NOTE — Discharge Instructions (Signed)
Take Bactrim as prescribed and ibuprofen for pain. Continue with warm compresses 3-4 times per day. Follow up with a primary care doctor to ensure resolution of symptoms.

## 2017-09-13 ENCOUNTER — Ambulatory Visit: Payer: BLUE CROSS/BLUE SHIELD | Admitting: Nurse Practitioner

## 2017-09-13 ENCOUNTER — Other Ambulatory Visit (HOSPITAL_COMMUNITY)
Admission: RE | Admit: 2017-09-13 | Discharge: 2017-09-13 | Disposition: A | Discharge status: Home / Self Care | Payer: BLUE CROSS/BLUE SHIELD | Source: Ambulatory Visit | Attending: Nurse Practitioner | Admitting: Nurse Practitioner

## 2017-09-13 ENCOUNTER — Encounter: Payer: Self-pay | Admitting: Nurse Practitioner

## 2017-09-13 VITALS — BP 108/68 | HR 86 | Temp 97.9°F | Ht 66.0 in | Wt 234.0 lb

## 2017-09-13 DIAGNOSIS — Z124 Encounter for screening for malignant neoplasm of cervix: Secondary | ICD-10-CM | POA: Insufficient documentation

## 2017-09-13 DIAGNOSIS — R739 Hyperglycemia, unspecified: Secondary | ICD-10-CM | POA: Diagnosis not present

## 2017-09-13 DIAGNOSIS — Z113 Encounter for screening for infections with a predominantly sexual mode of transmission: Secondary | ICD-10-CM | POA: Insufficient documentation

## 2017-09-13 DIAGNOSIS — Z7251 High risk heterosexual behavior: Secondary | ICD-10-CM

## 2017-09-13 DIAGNOSIS — Z0001 Encounter for general adult medical examination with abnormal findings: Secondary | ICD-10-CM | POA: Insufficient documentation

## 2017-09-13 DIAGNOSIS — Z13220 Encounter for screening for lipoid disorders: Secondary | ICD-10-CM | POA: Diagnosis not present

## 2017-09-13 DIAGNOSIS — Z136 Encounter for screening for cardiovascular disorders: Secondary | ICD-10-CM

## 2017-09-13 DIAGNOSIS — N898 Other specified noninflammatory disorders of vagina: Secondary | ICD-10-CM | POA: Diagnosis not present

## 2017-09-13 LAB — LIPID PANEL
CHOLESTEROL: 135 mg/dL (ref 0–200)
HDL: 36.3 mg/dL — ABNORMAL LOW (ref >39.00)
LDL CALC: 84 mg/dL (ref 0–99)
NonHDL: 98.64
TRIGLYCERIDES: 73 mg/dL (ref 0.0–149.0)
Total CHOL/HDL Ratio: 4
VLDL: 14.6 mg/dL (ref 0.0–40.0)

## 2017-09-13 LAB — COMPREHENSIVE METABOLIC PANEL
ALK PHOS: 50 U/L (ref 39–117)
ALT: 10 U/L (ref 0–35)
AST: 12 U/L (ref 0–37)
Albumin: 4 g/dL (ref 3.5–5.2)
BILIRUBIN TOTAL: 0.5 mg/dL (ref 0.2–1.2)
BUN: 10 mg/dL (ref 6–23)
CALCIUM: 9.4 mg/dL (ref 8.4–10.5)
CHLORIDE: 104 meq/L (ref 96–112)
CO2: 28 mEq/L (ref 19–32)
Creatinine, Ser: 0.61 mg/dL (ref 0.40–1.20)
GFR: 154.22 mL/min (ref >60.00)
Glucose, Bld: 85 mg/dL (ref 70–99)
Potassium: 4.2 mEq/L (ref 3.5–5.1)
Sodium: 138 mEq/L (ref 135–145)
TOTAL PROTEIN: 7.6 g/dL (ref 6.0–8.3)

## 2017-09-13 LAB — TSH: TSH: 0.89 u[IU]/mL (ref 0.35–4.50)

## 2017-09-13 LAB — CBC
HCT: 36.7 % (ref 36.0–46.0)
Hemoglobin: 12 g/dL (ref 12.0–15.0)
MCHC: 32.7 g/dL (ref 30.0–36.0)
MCV: 84.1 fl (ref 78.0–100.0)
Platelets: 323 10*3/uL (ref 150.0–400.0)
RBC: 4.36 Mil/uL (ref 3.87–5.11)
RDW: 15.7 % — ABNORMAL HIGH (ref 11.5–15.5)
WBC: 8.8 10*3/uL (ref 4.0–10.5)

## 2017-09-13 LAB — HEMOGLOBIN A1C: Hgb A1c MFr Bld: 5.6 % (ref 4.6–6.5)

## 2017-09-13 NOTE — Patient Instructions (Addendum)
Normal lab results. Negative HIV, hep B, hep C, and syphillis. Pending pap and wet prep.  Maintain low carb/low sugar diet and regular exercise.  Preventive Care for [place], Female The transition to life after high school as a young adult can be a stressful time with many changes. You may start seeing a primary care physician instead of a pediatrician. This is the time when your health care becomes your responsibility. Preventive care refers to lifestyle choices and visits with your health care provider that can promote health and wellness. What does preventive care include?  A yearly physical exam. This is also called an annual wellness visit.  Dental exams once or twice a year.  Routine eye exams. Ask your health care provider how often you should have your eyes checked.  Personal lifestyle choices, including: ? Daily care of your teeth and gums. ? Regular physical activity. ? Eating a healthy diet. ? Avoiding tobacco and drug use. ? Avoiding or limiting alcohol use. ? Practicing safe sex. ? Taking vitamin and mineral supplements as recommended by your health care provider. What happens during an annual wellness visit? Preventive care starts with a yearly visit to your primary care physician. The services and screenings done by your health care provider during your annual wellness visit will depend on your overall health, lifestyle risk factors, and family history of disease. Counseling Your health care provider may ask you questions about:  Past medical problems and your family's medical history.  Medicines or supplements you take.  Health insurance and access to health care.  Alcohol, tobacco, and drug use.  Your safety at home, work, or school.  Access to firearms.  Emotional well-being and how you cope with stress.  Relationship well-being.  Diet, exercise, and sleep habits.  Your sexual health and activity.  Your methods of birth control.  Your  menstrual cycle.  Your pregnancy history.  Screening You may have the following tests or measurements:  Height, weight, and BMI.  Blood pressure.  Lipid and cholesterol levels.  Tuberculosis skin test.  Skin exam.  Vision and hearing tests.  Screening test for hepatitis.  Screening tests for sexually transmitted diseases (STDs), if you are at risk.  BRCA-related cancer screening. This may be done if you have a family history of breast, ovarian, tubal, or peritoneal cancers.  Pelvic exam and Pap test. This may be done every 3 years starting at age 20.  Vaccines Your health care provider may recommend certain vaccines, such as:  Influenza vaccine. This is recommended every year.  Tetanus, diphtheria, and acellular pertussis (Tdap, Td) vaccine. You may need a Td booster every 10 years.  Varicella vaccine. You may need this if you have not been vaccinated.  HPV vaccine. If you are 68 or younger, you may need three doses over 6 months.  Measles, mumps, and rubella (MMR) vaccine. You may need at least one dose of MMR. You may also need a second dose.  Pneumococcal 13-valent conjugate (PCV13) vaccine. You may need this if you have certain conditions and were not previously vaccinated.  Pneumococcal polysaccharide (PPSV23) vaccine. You may need one or two doses if you smoke cigarettes or if you have certain conditions.  Meningococcal vaccine. One dose is recommended if you are age 44-21 years and a first-year college student living in a residence hall, or if you have one of several medical conditions. You may also need additional booster doses.  Hepatitis A vaccine. You may need this if you have certain  conditions or if you travel or work in places where you may be exposed to hepatitis A.  Hepatitis B vaccine. You may need this if you have certain conditions or if you travel or work in places where you may be exposed to hepatitis B.  Haemophilus influenzae type b (Hib)  vaccine. You may need this if you have certain risk factors.  Talk to your health care provider about which screenings and vaccines you need and how often you need them. What steps can I take to develop healthy behaviors?  Have regular preventive health care visits with your primary care physician and dentist.  Eat a healthy diet.  Drink enough fluid to keep your urine clear or pale yellow.  Stay active. Exercise at least 30 minutes 5 or more days of the week.  Use alcohol responsibly.  Maintain a healthy weight.  Do not use any products that contain nicotine, such as cigarettes, chewing tobacco, and e-cigarettes. If you need help quitting, ask your health care provider.  Do not use drugs.  Practice safe sex.  Use birth control (contraception) to prevent unwanted pregnancy. If you plan to become pregnant, see your health care provider for a pre-conception visit.  Find healthy ways to manage stress. How can I protect myself from injury? Injuries from violence or accidents are the leading cause of death among young adults and can often be prevented. Take these steps to help protect yourself:  Always wear your seat belt while driving or riding in a vehicle.  Do not drive if you have been drinking alcohol. Do not ride with someone who has been drinking.  Do not drive when you are tired or distracted. Do not text while driving.  Wear a helmet and other protective equipment during sports activities.  If you have firearms in your house, make sure you follow all gun safety procedures.  Seek help if you have been bullied, physically abused, or sexually abused.  Use the Internet responsibly to avoid dangers such as online bullying and online sexual predators.  What can I do to cope with stress? Young adults may face many new challenges that can be stressful, such as finding a job, going to college, moving away from home, managing money, being in a relationship, getting married, and  having children. To manage stress:  Avoid known stressful situations when you can.  Exercise regularly.  Find a stress-reducing activity that works best for you. Examples include meditation, yoga, listening to music, or reading.  Spend time in nature.  Keep a journal to write about your stress and how you respond.  Talk to your health care provider about stress. He or she may suggest counseling.  Spend time with supportive friends or family.  Do not cope with stress by: ? Drinking alcohol or using drugs. ? Smoking cigarettes. ? Eating.  Where can I get more information? Learn more about preventive care and healthy habits from:  [place] and Gynecologists: KaraokeExchange.nl  U.[place] Probation officer Task Force: StageSync.si  National Adolescent and [place]: StrategicRoad.nl  American Academy of Pediatrics Bright Futures: https://brightfutures.MemberVerification.co.za  Society for Adolescent Health and Medicine: MoralBlog.co.za.aspx  PodExchange.nl: ToyLending.fr  This information is not intended to replace advice given to you by your health care provider. Make sure you discuss any questions you have with your health care provider. Document Released: 12/17/2015 Document Revised: 01/07/2016 Document Reviewed: 12/17/2015 Elsevier Interactive Patient Education  [employer].

## 2017-09-13 NOTE — Progress Notes (Signed)
Subjective:  Patient ID: Melanie Townsend, female    DOB: 09/01/1990  Age: 27 y.o. MRN: [id number]  CC: Gynecologic Exam (est care/boil under arms,BV?-discharge and odor/) and Annual Exam (fasting (new patient))  Vaginal Discharge  The patient's primary symptoms include a genital odor and vaginal discharge. The patient's pertinent negatives include no genital itching, genital lesions, genital rash, missed menses, pelvic pain or vaginal bleeding. This is a chronic problem. The current episode started more than 1 year ago. The problem occurs intermittently. The problem has been waxing and waning. She is not pregnant. Pertinent negatives include no abdominal pain, anorexia, back pain, chills, constipation, diarrhea, discolored urine, dysuria, fever, flank pain, frequency, headaches, hematuria, joint pain, joint swelling, nausea, painful intercourse, rash, sore throat, urgency or vomiting. The vaginal discharge was milky and thick. The vaginal bleeding is typical of menses. She has not been passing clots. She has not been passing tissue. Nothing aggravates the symptoms. She has tried antibiotics (metronidazole and tindamax) for the symptoms. She is sexually active. It is unknown whether or not her partner has an STD. She uses nothing for contraception. Her menstrual history has been regular. Her past medical history is significant for an STD and vaginosis. There is no history of a gynecological surgery, herpes simplex or PID.  she is requesting for all  STD testing today.  Hx of trichomoniasis and chlamydia 5-541yrs ago. Sexually active: unprotected occassional douching, temporal relief.  [place]es [place] clinic in [place], Dr. Marchia Bondoe. Prescribes phentermine and vitamin B12 injections.  Hx of recurrent axillary abscess: Last I&D and oral abx use 06/2017. Uses razor blade every 2weeks. Used hair removal through waxing 1week ago. Reports excessive sweating.  Depression screen PHQ 2/9  09/13/2017  Decreased Interest 1  Down, Depressed, Hopeless 1  PHQ - 2 Score 2   Outpatient Medications Prior to Visit  Medication Sig Dispense Refill  . NON FORMULARY Apple cyder vinegar tablet OTC    . NON FORMULARY Zinc OTC    . Omega-3 Fatty Acids (FISH OIL PO) Take by mouth.    . phentermine (ADIPEX-P) 37.5 MG tablet TK 1 T PO D  0  . Probiotic Product (PROBIOTIC DAILY PO) Take by mouth.    . cephALEXin (KEFLEX) 500 MG capsule Take 1 capsule (500 mg total) by mouth 2 (two) times daily. (Patient not taking: Reported on 09/13/2017) 10 capsule 0  . HYDROcodone-acetaminophen (NORCO/VICODIN) 5-325 MG tablet Take 1 tablet by mouth every 4 (four) hours as needed. (Patient not taking: Reported on 09/13/2017) 10 tablet 0  . ibuprofen (ADVIL,MOTRIN) 600 MG tablet Take 1 tablet (600 mg total) every 6 (six) hours as needed by mouth. (Patient not taking: Reported on 09/13/2017) 30 tablet 0   No facility-administered medications prior to visit.    Social History   Socioeconomic History  . Marital status: Single    Spouse name: Not on file  . Number of children: Not on file  . Years of education: Not on file  . Highest education level: Not on file  Social Needs  . Financial resource strain: Not on file  . Food insecurity - worry: Not on file  . Food insecurity - inability: Not on file  . Transportation needs - medical: Not on file  . Transportation needs - non-medical: Not on file  Occupational History  . Not on file  Tobacco Use  . Smoking status: Current Every Day Smoker    Packs/day: 0.50    Types: Cigarettes  . Smokeless  tobacco: Never Used  Substance and Sexual Activity  . Alcohol use: Yes  . Drug use: No  . Sexual activity: Yes    Birth control/protection: None  Other Topics Concern  . Not on file  Social History Narrative  . Not on file   No past medical history on file.   Family History  Problem Relation Age of Onset  . Crohn's disease Father   . Diabetes Maternal  Uncle   . Diabetes Maternal Grandmother   . Diabetes Paternal Grandmother    ROS Review of Systems  Constitutional: Negative for chills and fever.  HENT: Negative.  Negative for sore throat.   Eyes: Negative.   Respiratory: Negative.   Cardiovascular: Negative.   Gastrointestinal: Negative for abdominal pain, anorexia, constipation, diarrhea, nausea and vomiting.  Genitourinary: Positive for vaginal discharge. Negative for dysuria, flank pain, frequency, hematuria, missed menses, pelvic pain and urgency.  Musculoskeletal: Negative for back pain and joint pain.  Skin: Negative for rash.  Neurological: Negative.  Negative for headaches.  Endo/Heme/Allergies: Negative.   Psychiatric/Behavioral: The patient does not have insomnia.     Objective:  BP 108/68   Pulse 86   Temp 97.9 F (36.6 C)   Ht 5\' 6"  (1.676 m)   Wt 234 lb (106.1 kg)   LMP 08/21/2017   SpO2 100%   BMI 37.77 kg/m   BP Readings from Last 3 Encounters:  09/13/17 108/68  07/03/17 124/79  02/07/17 115/73    Wt Readings from Last 3 Encounters:  09/13/17 234 lb (106.1 kg)  07/03/17 235 lb (106.6 kg)  05/25/16 231 lb 6 oz (105 kg)    Physical Exam  Constitutional: She is oriented to person, place, and time. No distress.  HENT:  Right Ear: External ear normal.  Left Ear: External ear normal.  Nose: Nose normal.  Mouth/Throat: No oropharyngeal exudate.  Eyes: No scleral icterus.  Neck: Normal range of motion. Neck supple. No thyromegaly present.  Cardiovascular: Normal rate, regular rhythm and normal heart sounds.  Pulmonary/Chest: Effort normal and breath sounds normal. No respiratory distress. She exhibits no tenderness. Right breast exhibits no inverted nipple, no mass, no nipple discharge, no skin change and no tenderness. Left breast exhibits no inverted nipple, no mass, no nipple discharge, no skin change and no tenderness. Breasts are symmetrical.  Abdominal: Soft. She exhibits no distension.    Genitourinary: Rectal exam shows external hemorrhoid. Rectal exam shows no tenderness. No breast tenderness or discharge. Pelvic exam was performed with patient supine. There is no rash or tenderness on the right labia. There is no rash or tenderness on the left labia. Cervix exhibits discharge. Cervix exhibits no motion tenderness and no friability. Right adnexum displays no mass, no tenderness and no fullness. Left adnexum displays no mass, no tenderness and no fullness. Vaginal discharge found.  Genitourinary Comments: White thick, homogenous vaginal discharge.  Musculoskeletal: Normal range of motion. She exhibits no edema.  Lymphadenopathy:    She has no cervical adenopathy.  Neurological: She is alert and oriented to person, place, and time.  Skin: Skin is warm and dry.  Incision scars on bilateral axillary  Psychiatric: She has a normal mood and affect. Her behavior is normal.    Lab Results  Component Value Date   WBC 8.8 09/13/2017   HGB 12.0 09/13/2017   HCT 36.7 09/13/2017   PLT 323.0 09/13/2017   GLUCOSE 85 09/13/2017   CHOL 135 09/13/2017   TRIG 73.0 09/13/2017   HDL 36.30 (L)  09/13/2017   LDLCALC 84 09/13/2017   ALT 10 09/13/2017   AST 12 09/13/2017   NA 138 09/13/2017   K 4.2 09/13/2017   CL 104 09/13/2017   CREATININE 0.61 09/13/2017   BUN 10 09/13/2017   CO2 28 09/13/2017   TSH 0.89 09/13/2017   HGBA1C 5.6 09/13/2017     Assessment & Plan:   Melanie Townsend was seen today for gynecologic exam and annual exam.  Diagnoses and all orders for this visit:  Encounter for preventative adult health care exam with abnormal findings -     TSH -     CBC -     Comprehensive metabolic panel -     Lipid panel -     Cytology - PAP  Vaginal discharge -     HIV antibody -     Cervicovaginal ancillary only  Unprotected sexual intercourse -     HIV antibody -     Cervicovaginal ancillary only -     Hepatitis B Core Antibody, IgM -     Hepatitis C Antibody -      RPR  Encounter for lipid screening for cardiovascular disease -     Lipid panel  Screening examination for STD (sexually transmitted disease) -     HIV antibody -     Cervicovaginal ancillary only -     Hepatitis B Core Antibody, IgM -     Hepatitis C Antibody -     RPR  Hyperglycemia -     Hemoglobin A1c  Encounter for Papanicolaou smear for cervical cancer screening -     Cytology - PAP   I have discontinued Melanie Townsend's HYDROcodone-acetaminophen, cephALEXin, and ibuprofen. I am also having her maintain her phentermine, NON FORMULARY, NON FORMULARY, Omega-3 Fatty Acids (FISH OIL PO), and Probiotic Product (PROBIOTIC DAILY PO).  No orders of the defined types were placed in this encounter.  Recent Results (from the past 2160 hour(s))  Hemoglobin A1c     Status: None   Collection Time: 09/13/17  9:18 AM  Result Value Ref Range   Hgb A1c MFr Bld 5.6 4.6 - 6.5 %    Comment: Glycemic Control Guidelines for People with Diabetes:Non Diabetic:  <6%Goal of Therapy: <7%Additional Action Suggested:  >8%   TSH     Status: None   Collection Time: 09/13/17  9:18 AM  Result Value Ref Range   TSH 0.89 0.35 - 4.50 uIU/mL  CBC     Status: Abnormal   Collection Time: 09/13/17  9:18 AM  Result Value Ref Range   WBC 8.8 4.0 - 10.5 K/uL   RBC 4.36 3.87 - 5.11 Mil/uL   Platelets 323.0 150.0 - 400.0 K/uL   Hemoglobin 12.0 12.0 - 15.0 g/dL   HCT 96.0 45.4 - 09.8 %   MCV 84.1 78.0 - 100.0 fl   MCHC 32.7 30.0 - 36.0 g/dL   RDW 11.9 (H) 14.7 - 82.9 %  Comprehensive metabolic panel     Status: None   Collection Time: 09/13/17  9:18 AM  Result Value Ref Range   Sodium 138 135 - 145 mEq/L   Potassium 4.2 3.5 - 5.1 mEq/L   Chloride 104 96 - 112 mEq/L   CO2 28 19 - 32 mEq/L   Glucose, Bld 85 70 - 99 mg/dL   BUN 10 6 - 23 mg/dL   Creatinine, Ser 5.62 0.40 - 1.20 mg/dL   Total Bilirubin 0.5 0.2 - 1.2 mg/dL   Alkaline Phosphatase 50 39 -  117 U/L   AST 12 0 - 37 U/L   ALT 10 0 - 35  U/L   Total Protein 7.6 6.0 - 8.3 g/dL   Albumin 4.0 3.5 - 5.2 g/dL   Calcium 9.4 8.4 - 16.1 mg/dL   GFR 096.04 >54.09 mL/min  Lipid panel     Status: Abnormal   Collection Time: 09/13/17  9:18 AM  Result Value Ref Range   Cholesterol 135 0 - 200 mg/dL    Comment: ATP III Classification       Desirable:  < 200 mg/dL               Borderline High:  200 - 239 mg/dL          High:  > = 811 mg/dL   Triglycerides 91.4 0.0 - 149.0 mg/dL    Comment: Normal:  <782 mg/dLBorderline High:  150 - 199 mg/dL   HDL 95.62 (L) >13.08 mg/dL   VLDL 65.7 0.0 - 84.6 mg/dL   LDL Cholesterol 84 0 - 99 mg/dL   Total CHOL/HDL Ratio 4     Comment:                Men          Women1/2 Average Risk     3.4          3.3Average Risk          5.0          4.42X Average Risk          9.6          7.13X Average Risk          15.0          11.0                       NonHDL 98.64     Comment: NOTE:  Non-HDL goal should be 30 mg/dL higher than patient's LDL goal (i.e. LDL goal of < 70 mg/dL, would have non-HDL goal of < 100 mg/dL)  HIV antibody     Status: None   Collection Time: 09/13/17  9:18 AM  Result Value Ref Range   HIV 1&2 Ab, 4th Generation NON-REACTIVE NON-REACTI    Comment: HIV-1 antigen and HIV-1/HIV-2 antibodies were not detected. There is no laboratory evidence of HIV infection. Marland Kitchen PLEASE NOTE: This information has been disclosed to you from records whose confidentiality may be protected by state law.  If your state requires such protection, then the state law prohibits you from making any further disclosure of the information without the specific written consent of the person to whom it pertains, or as otherwise permitted by law. A general authorization for the release of medical or other information is NOT sufficient for this purpose. . For additional information please refer to http://education.questdiagnostics.com/faq/FAQ106 (This link is being provided for informational/ educational purposes  only.) . Marland Kitchen The performance of this assay has not been clinically validated in patients less than 58 years old. .   Hepatitis B Core Antibody, IgM     Status: None   Collection Time: 09/13/17  9:18 AM  Result Value Ref Range   Hep B C IgM NON-REACTIVE NON-REACTI  Hepatitis C Antibody     Status: None   Collection Time: 09/13/17  9:18 AM  Result Value Ref Range   Hepatitis C Ab NON-REACTIVE NON-REACTI   SIGNAL TO CUT-OFF 0.02 <1.00  RPR     Status:  None   Collection Time: 09/13/17  9:18 AM  Result Value Ref Range   RPR Ser Ql NON-REACTIVE NON-REACTI   Follow-up: Return if symptoms worsen or fail to improve.  Alysia Penna, NP

## 2017-09-14 ENCOUNTER — Telehealth: Payer: Self-pay | Admitting: Nurse Practitioner

## 2017-09-14 LAB — HEPATITIS C ANTIBODY
Hepatitis C Ab: NONREACTIVE
SIGNAL TO CUT-OFF: 0.02 (ref <1.00)

## 2017-09-14 LAB — HEPATITIS B CORE ANTIBODY, IGM: HEP B C IGM: NONREACTIVE

## 2017-09-14 LAB — CERVICOVAGINAL ANCILLARY ONLY
Chlamydia: NEGATIVE
Neisseria Gonorrhea: NEGATIVE
Trichomonas: NEGATIVE

## 2017-09-14 LAB — HIV ANTIBODY (ROUTINE TESTING W REFLEX): HIV 1&2 Ab, 4th Generation: NONREACTIVE

## 2017-09-14 LAB — RPR: RPR Ser Ql: NONREACTIVE

## 2017-09-14 NOTE — Telephone Encounter (Signed)
Pt. Called to see if PAP smear was back. Told her it was not and as soon as provider received it, she would be notified. Verbalizes understanding.

## 2017-09-15 LAB — CYTOLOGY - PAP: Diagnosis: NEGATIVE

## 2017-09-25 ENCOUNTER — Ambulatory Visit: Payer: BLUE CROSS/BLUE SHIELD | Admitting: Nurse Practitioner

## 2017-09-25 ENCOUNTER — Encounter: Payer: Self-pay | Admitting: Nurse Practitioner

## 2017-09-25 ENCOUNTER — Telehealth: Payer: Self-pay | Admitting: Nurse Practitioner

## 2017-09-25 VITALS — BP 116/78 | HR 76 | Temp 97.7°F | Ht 66.0 in | Wt 236.0 lb

## 2017-09-25 DIAGNOSIS — F4323 Adjustment disorder with mixed anxiety and depressed mood: Secondary | ICD-10-CM

## 2017-09-25 DIAGNOSIS — N898 Other specified noninflammatory disorders of vagina: Secondary | ICD-10-CM

## 2017-09-25 DIAGNOSIS — L732 Hidradenitis suppurativa: Secondary | ICD-10-CM | POA: Insufficient documentation

## 2017-09-25 DIAGNOSIS — Z0289 Encounter for other administrative examinations: Secondary | ICD-10-CM

## 2017-09-25 MED ORDER — CHLORHEXIDINE GLUCONATE 2 % EX LIQD: 1.0000 "application " | Freq: Every day | CUTANEOUS | 0 refills | Status: DC

## 2017-09-25 MED ORDER — DOXYCYCLINE HYCLATE 100 MG PO TABS: 100.0000 mg | ORAL_TABLET | Freq: Two times a day (BID) | ORAL | 0 refills | Status: AC

## 2017-09-25 NOTE — Patient Instructions (Addendum)
Avoid use of razors, or waxing. May use only hair removal cream or scissors.  Continue warm compress three times a day, at a time.  You will be contacted to schedule appt with durgeon.  Your FMLA paperwork will be completed for 2days per month x 3months only.  Hidradenitis Suppurativa Hidradenitis suppurativa is a long-term (chronic) skin disease that starts with blocked sweat glands or hair follicles. Bacteria may grow in these blocked openings of your skin. Hidradenitis suppurativa is like a severe form of acne that develops in areas of your body where acne would be unusual. It is most likely to affect the areas of your body where skin rubs against skin and becomes moist. This includes your:  Underarms.  Groin.  Genital areas.  Buttocks.  Upper thighs.  Breasts.  Hidradenitis suppurativa may start out with small pimples. The pimples can develop into deep sores that break open (rupture) and drain pus. Over time your skin may thicken and become scarred. Hidradenitis suppurativa cannot be passed from person to person. What are the causes? The exact cause of hidradenitis suppurativa is not known. This condition may be due to:  Female and female hormones. The condition is rare before and after puberty.  An overactive body defense system (immune system). Your immune system may overreact to the blocked hair follicles or sweat glands and cause swelling and pus-filled sores.  What increases the risk? You may have a higher risk of hidradenitis suppurativa if you:  Are a woman.  Are between ages 73 and 24.  Have a family history of hidradenitis suppurativa.  Have a personal history of acne.  Are overweight.  Smoke.  Take the drug lithium.  What are the signs or symptoms? The first signs of an outbreak are usually painful skin bumps that look like pimples. As the condition progresses:  Skin bumps may get bigger and grow deeper into the skin.  Bumps under the skin may  rupture and drain smelly pus.  Skin may become itchy and infected.  Skin may thicken and scar.  Drainage may continue through tunnels under the skin (fistulas).  Walking and moving your arms can become painful.  How is this diagnosed? Your health care provider may diagnose hidradenitis suppurativa based on your medical history and your signs and symptoms. A physical exam will also be done. You may need to see a health care provider who specializes in skin diseases (dermatologist). You may also have tests done to confirm the diagnosis. These can include:  Swabbing a sample of pus or drainage from your skin so it can be sent to the lab and tested for infection.  Blood tests to check for infection.  How is this treated? The same treatment will not work for everybody with hidradenitis suppurativa. Your treatment will depend on how severe your symptoms are. You may need to try several treatments to find what works best for you. Part of your treatment may include cleaning and bandaging (dressing) your wounds. You may also have to take medicines, such as the following:  Antibiotics.  Acne medicines.  Medicines to block or suppress the immune system.  A diabetes medicine (metformin) is sometimes used to treat this condition.  For women, birth control pills can sometimes help relieve symptoms.  You may need surgery if you have a severe case of hidradenitis suppurativa that does not respond to medicine. Surgery may involve:  Using a laser to clear the skin and remove hair follicles.  Opening and draining deep sores.  Removing the areas of skin that are diseased and scarred.  Follow these instructions at home:  Learn as much as you can about your disease, and work closely with your health care providers.  Take medicines only as directed by your health care provider.  If you were prescribed an antibiotic medicine, finish it all even if you start to feel better.  If you are  overweight, losing weight may be very helpful. Try to reach and maintain a healthy weight.  Do not use any tobacco products, including cigarettes, chewing tobacco, or electronic cigarettes. If you need help quitting, ask your health care provider.  Do not shave the areas where you get hidradenitis suppurativa.  Do not wear deodorant.  Wear loose-fitting clothes.  Try not to overheat and get sweaty.  Take a daily bleach bath as directed by your health care provider. ? Fill your bathtub halfway with water. ? Pour in  cup of unscented household bleach. ? Soak for 5-10 minutes.  Cover sore areas with a warm, clean washcloth (compress) for 5-10 minutes. Contact a health care provider if:  You have a flare-up of hidradenitis suppurativa.  You have chills or a fever.  You are having trouble controlling your symptoms at home. This information is not intended to replace advice given to you by your health care provider. Make sure you discuss any questions you have with your health care provider. Document Released: 03/15/2004 Document Revised: 01/07/2016 Document Reviewed: 11/01/2013 Elsevier Interactive Patient Education  2018 [employer]

## 2017-09-25 NOTE — Telephone Encounter (Signed)
Copied from CRM [phone]. Topic: Inquiry >> Sep 25, 2017 11:25 AM Stephannie LiSimmons, Aeden Matranga L, NT wrote: Reason for CRM: Patient wants to know if her FMLA days could be  re adjusted wants to know if she needs to come in to be in seen  or can someone call her to discuss ,please call with information  please advise  [phone]

## 2017-09-25 NOTE — Progress Notes (Signed)
Subjective:  Patient ID: Melanie Townsend, female    DOB: August 27, 1990  Age: 27 y.o. MRN: [id number]  CC: Follow-up (abscess, FMLA completed) and Vaginal Discharge (wants referral)  HPI  Melanie Townsend presents with recurrent bilateral axilla abscess. Onset 1year ago. Prior to seeing me 09/13/2017, she has visited ED 2times per year since 2017. Each time she was either treated with oral abx and/or I&D. Last treated with oral bactrim 07/04/2017. For home remedy, she has tried warm compress and different hair removal techniques (use of razor, and waxing); with no improvement. She is unable to wear bra due to axilla discomfort. She will like FMLA paperwork to accommodate days she has to stay home with to abscess.  She will also like referral to GYN for further evaluation of chronic vaginal discharge.  She reports increased anxiety and self awareness due to vaginal discharge and axilla abscess. She will like referral for counseling.  Outpatient Medications Prior to Visit  Medication Sig Dispense Refill  . NON FORMULARY Apple cyder vinegar tablet OTC    . NON FORMULARY Zinc OTC    . Omega-3 Fatty Acids (FISH OIL PO) Take by mouth.    . phentermine (ADIPEX-P) 37.5 MG tablet TK 1 T PO D  0  . Probiotic Product (PROBIOTIC DAILY PO) Take by mouth.     No facility-administered medications prior to visit.     ROS See HPI  Objective:  BP 116/78   Pulse 76   Temp 97.7 F (36.5 C)   Ht 5\' 6"  (1.676 m)   Wt 236 lb (107 kg)   BMI 38.09 kg/m   BP Readings from Last 3 Encounters:  09/25/17 116/78  09/13/17 108/68  07/03/17 124/79    Wt Readings from Last 3 Encounters:  09/25/17 236 lb (107 kg)  09/13/17 234 lb (106.1 kg)  07/03/17 235 lb (106.6 kg)    Physical Exam  Constitutional: She is oriented to person, place, and time. No distress.  Neck: Normal range of motion. Neck supple.  Cardiovascular: Normal rate.  Pulmonary/Chest: Effort normal.  Neurological: She is alert  and oriented to person, place, and time.  Skin: Skin is warm and dry. No erythema.  Bilateral axilla induration, no erythema, no fluctuance.   Vitals reviewed.  Lab Results  Component Value Date   WBC 8.8 09/13/2017   HGB 12.0 09/13/2017   HCT 36.7 09/13/2017   PLT 323.0 09/13/2017   GLUCOSE 85 09/13/2017   CHOL 135 09/13/2017   TRIG 73.0 09/13/2017   HDL 36.30 (L) 09/13/2017   LDLCALC 84 09/13/2017   ALT 10 09/13/2017   AST 12 09/13/2017   NA 138 09/13/2017   K 4.2 09/13/2017   CL 104 09/13/2017   CREATININE 0.61 09/13/2017   BUN 10 09/13/2017   CO2 28 09/13/2017   TSH 0.89 09/13/2017   HGBA1C 5.6 09/13/2017    Assessment & Plan:   Melanie Townsend was seen today for follow-up and vaginal discharge.  Diagnoses and all orders for this visit:  Vaginal discharge -     Ambulatory referral to Gynecology  Hidradenitis axillaris -     Ambulatory referral to General Surgery -     Chlorhexidine Gluconate 2 % LIQD; Apply 1 application topically daily. Use for 1week -     doxycycline (VIBRA-TABS) 100 MG tablet; Take 1 tablet (100 mg total) by mouth 2 (two) times daily for 7 days.  Adjustment disorder with mixed anxiety and depressed mood -     Ambulatory referral  to Psychology   I am having Melanie Townsend start on Chlorhexidine Gluconate and doxycycline. I am also having her maintain her phentermine, NON FORMULARY, NON FORMULARY, Omega-3 Fatty Acids (FISH OIL PO), and Probiotic Product (PROBIOTIC DAILY PO).  Meds ordered this encounter  Medications  . Chlorhexidine Gluconate 2 % LIQD    Sig: Apply 1 application topically daily. Use for 1week    Refill:  0    Order Specific Question:   Supervising Provider    Answer:   Dianne DunARON, TALIA M [[id number]]  . doxycycline (VIBRA-TABS) 100 MG tablet    Sig: Take 1 tablet (100 mg total) by mouth 2 (two) times daily for 7 days.    Dispense:  14 tablet    Refill:  0    Order Specific Question:   Supervising Provider    Answer:   Dianne DunARON,  TALIA M [[id number]]    Follow-up: Return if symptoms worsen or fail to improve.  Melanie Pennaharlotte Wilho Sharpley, NP

## 2017-09-27 DIAGNOSIS — F320 Major depressive disorder, single episode, mild: Secondary | ICD-10-CM | POA: Diagnosis not present

## 2017-09-27 DIAGNOSIS — Z6838 Body mass index (BMI) 38.0-38.9, adult: Secondary | ICD-10-CM | POA: Diagnosis not present

## 2017-09-27 DIAGNOSIS — L732 Hidradenitis suppurativa: Secondary | ICD-10-CM | POA: Diagnosis not present

## 2017-09-27 DIAGNOSIS — N898 Other specified noninflammatory disorders of vagina: Secondary | ICD-10-CM | POA: Diagnosis not present

## 2017-10-03 ENCOUNTER — Telehealth: Payer: Self-pay | Admitting: Family Medicine

## 2017-10-03 NOTE — Telephone Encounter (Signed)
Called and left VM for pt trying to confirm apt tomorrow 10/04/17. Advised of time, building # and time policies. °

## 2017-10-03 NOTE — Progress Notes (Signed)
Chief Complaint  Patient presents with  . New Patient (Initial Visit)    establish care.  Pt is experiencing some depression due to continued boils under arms and recurrent bv.    HPI   Hidradenitis suppurativa Pt reports that she has been having chronic boils in her bilateral axilla She states that they have been reoccurring and seems to be mostly around her menstrual cycles She states that she was seen 09/25/17 and was given doxycycline and chlorhexadine but did not start those medications because "the antibiotics do not work". She reports that she was previous recommended to a surgeon She reports that she will be seeing a dermatologist today to discuss Lab Results  Component Value Date   HGBA1C 5.6 09/13/2017   Depressed mood Patient reports that she has been having depressed moods  She states that she just feels frustrated about the different boils  She states that she is taking phentermine She states that she still gets depressed even on the phentermine   Depression screen [facility] 2/9 10/04/2017 09/13/2017  Decreased Interest 2 1  Down, Depressed, Hopeless 2 1  PHQ - 2 Score 4 2  Altered sleeping 2 -  Tired, decreased energy 1 -  Change in appetite 1 -  Feeling bad or failure about yourself  1 -  Trouble concentrating 1 -  Moving slowly or fidgety/restless 0 -  Suicidal thoughts 0 -  PHQ-9 Score 10 -  Difficult doing work/chores Very difficult -    Obesity She reports that she is decreasing her phentermine She does not exercise outside of work At work she takes the stairs She plans to start 30 minutes every other day Wt Readings from Last 3 Encounters:  10/04/17 233 lb 12.8 oz (106.1 kg)  09/25/17 236 lb (107 kg)  09/13/17 234 lb (106.1 kg)     No past medical history on file.  Current Outpatient Medications  Medication Sig Dispense Refill  . NON FORMULARY Apple cyder vinegar tablet OTC    . NON FORMULARY Zinc OTC    . Omega-3 Fatty Acids (FISH OIL PO) Take by  mouth.    . phentermine (ADIPEX-P) 37.5 MG tablet TK 1 T PO D  0  . Probiotic Product (PROBIOTIC DAILY PO) Take by mouth.    . Chlorhexidine Gluconate 2 % LIQD Apply 1 application topically daily. Use for 1week (Patient not taking: Reported on 10/04/2017)  0   No current facility-administered medications for this visit.     Allergies:  Allergies  Allergen Reactions  . Iodine     Hives     Past Surgical History:  Procedure Laterality Date  . pilonidal cyst      Social History   Socioeconomic History  . Marital status: Single    Spouse name: None  . Number of children: None  . Years of education: None  . Highest education level: None  Social Needs  . Financial resource strain: None  . Food insecurity - worry: None  . Food insecurity - inability: None  . Transportation needs - medical: None  . Transportation needs - non-medical: None  Occupational History  . None  Tobacco Use  . Smoking status: Current Some Day Smoker    Packs/day: 0.50    Types: Cigarettes  . Smokeless tobacco: Never Used  Substance and Sexual Activity  . Alcohol use: Yes  . Drug use: No  . Sexual activity: Yes    Birth control/protection: None  Other Topics Concern  . None  Social History Narrative  . None    Family History  Problem Relation Age of Onset  . Crohn's disease Father   . Diabetes Maternal Uncle   . Diabetes Maternal Grandmother   . Diabetes Paternal Grandmother      ROS Review of Systems See HPI Constitution: No fevers or chills No malaise No diaphoresis Eyes: no blurry vision, no double vision Heart: no palpitations or chest pains GI: no diarrhea, +constipation GU: no dysuria or hematuria Neuro: no dizziness or headaches all others reviewed and negative   Objective: Vitals:   10/04/17 0911  BP: 119/86  Pulse: (!) 108  Resp: 17  Temp: 99.1 F (37.3 C)  TempSrc: Oral  SpO2: 100%  Weight: 233 lb 12.8 oz (106.1 kg)  Height: 5\' 6"  (1.676 m)  Body mass index  is 37.74 kg/m.   Physical Exam  Constitutional: She is oriented to person, place, and time. She appears well-developed and well-nourished.  HENT:  Head: Normocephalic and atraumatic.  Eyes: Conjunctivae and EOM are normal.  Neck: Normal range of motion. No thyromegaly present.  Cardiovascular: Normal rate, regular rhythm and normal heart sounds.  No murmur heard. Pulmonary/Chest: Effort normal and breath sounds normal. No stridor. No respiratory distress. She has no wheezes.  Neurological: She is alert and oriented to person, place, and time.  Skin: Skin is warm. Capillary refill takes less than 2 seconds.  Numerous nodules in the bilateral axillae Tender to palpation  Psychiatric: She has a normal mood and affect. Her behavior is normal. Judgment and thought content normal.     Assessment and Plan Melanie Townsend was seen today for new patient (initial visit).  Diagnoses and all orders for this visit:  Hidradenitis axillaris- follow up with Dermatology Discussed using contraception to prevent period cycles and hormonal fluctuation  Hemorrhoids, unspecified hemorrhoid type- advised fiber supplementation, water and exercise  Discussed weight loss strategies  Class 2 obesity due to excess calories without serious comorbidity with body mass index (BMI) of 37.0 to 37.9 in adult- discussed that she should start by exercising 10-15 minutes every day   A total of 30 minutes were spent face-to-face with the patient during this encounter and over half of that time was spent on counseling and coordination of care.   Melanie Townsend

## 2017-10-04 ENCOUNTER — Encounter: Payer: Self-pay | Admitting: Family Medicine

## 2017-10-04 ENCOUNTER — Other Ambulatory Visit: Payer: Self-pay

## 2017-10-04 ENCOUNTER — Ambulatory Visit: Payer: BLUE CROSS/BLUE SHIELD | Admitting: Family Medicine

## 2017-10-04 VITALS — BP 119/86 | HR 108 | Temp 99.1°F | Resp 17 | Ht 66.0 in | Wt 233.8 lb

## 2017-10-04 DIAGNOSIS — K649 Unspecified hemorrhoids: Secondary | ICD-10-CM | POA: Diagnosis not present

## 2017-10-04 DIAGNOSIS — Z6837 Body mass index (BMI) 37.0-37.9, adult: Secondary | ICD-10-CM

## 2017-10-04 DIAGNOSIS — L732 Hidradenitis suppurativa: Secondary | ICD-10-CM

## 2017-10-04 DIAGNOSIS — E6609 Other obesity due to excess calories: Secondary | ICD-10-CM

## 2017-10-04 NOTE — Patient Instructions (Addendum)
Exercise for 10 minutes daily to lose weight   Follow up with Dermatology for evaluation     IF you received an x-ray today, you will receive an invoice from [facility]. Please contact [facility] at [phone] with questions or concerns regarding your invoice.   IF you received labwork today, you will receive an invoice from [facility]. Please contact [facility] at [phone] with questions or concerns regarding your invoice.   Our billing staff will not be able to assist you with questions regarding bills from these companies.  You will be contacted with the lab results as soon as they are available. The fastest way to get your results is to activate your My Chart account. Instructions are located on the last page of this paperwork. If you have not heard from us regarding the results in 2 weeks, please contact this office.        Hidradenitis Suppurativa Hidradenitis suppurativa is a long-term (chronic) skin disease that starts with blocked sweat glands or hair follicles. Bacteria may grow in these blocked openings of your skin. Hidradenitis suppurativa is like a severe form of acne that develops in areas of your body where acne would be unusual. It is most likely to affect the areas of your body where skin rubs against skin and becomes moist. This includes your:  Underarms.  Groin.  Genital areas.  Buttocks.  Upper thighs.  Breasts.  Hidradenitis suppurativa may start out with small pimples. The pimples can develop into deep sores that break open (rupture) and drain pus. Over time your skin may thicken and become scarred. Hidradenitis suppurativa cannot be passed from person to person. What are the causes? The exact cause of hidradenitis suppurativa is not known. This condition may be due to:  Female and female hormones. The condition is rare before and after puberty.  An overactive body defense system (immune system). Your immune system may overreact to  the blocked hair follicles or sweat glands and cause swelling and pus-filled sores.  What increases the risk? You may have a higher risk of hidradenitis suppurativa if you:  Are a woman.  Are between ages 3211 and 355.  Have a family history of hidradenitis suppurativa.  Have a personal history of acne.  Are overweight.  Smoke.  Take the drug lithium.  What are the signs or symptoms? The first signs of an outbreak are usually painful skin bumps that look like pimples. As the condition progresses:  Skin bumps may get bigger and grow deeper into the skin.  Bumps under the skin may rupture and drain smelly pus.  Skin may become itchy and infected.  Skin may thicken and scar.  Drainage may continue through tunnels under the skin (fistulas).  Walking and moving your arms can become painful.  How is this diagnosed? Your health care provider may diagnose hidradenitis suppurativa based on your medical history and your signs and symptoms. A physical exam will also be done. You may need to see a health care provider who specializes in skin diseases (dermatologist). You may also have tests done to confirm the diagnosis. These can include:  Swabbing a sample of pus or drainage from your skin so it can be sent to the lab and tested for infection.  Blood tests to check for infection.  How is this treated? The same treatment will not work for everybody with hidradenitis suppurativa. Your treatment will depend on how severe your symptoms are. You may need to try several treatments to find what works  best for you. Part of your treatment may include cleaning and bandaging (dressing) your wounds. You may also have to take medicines, such as the following:  Antibiotics.  Acne medicines.  Medicines to block or suppress the immune system.  A diabetes medicine (metformin) is sometimes used to treat this condition.  For women, birth control pills can sometimes help relieve symptoms.  You  may need surgery if you have a severe case of hidradenitis suppurativa that does not respond to medicine. Surgery may involve:  Using a laser to clear the skin and remove hair follicles.  Opening and draining deep sores.  Removing the areas of skin that are diseased and scarred.  Follow these instructions at home:  Learn as much as you can about your disease, and work closely with your health care providers.  Take medicines only as directed by your health care provider.  If you were prescribed an antibiotic medicine, finish it all even if you start to feel better.  If you are overweight, losing weight may be very helpful. Try to reach and maintain a healthy weight.  Do not use any tobacco products, including cigarettes, chewing tobacco, or electronic cigarettes. If you need help quitting, ask your health care provider.  Do not shave the areas where you get hidradenitis suppurativa.  Do not wear deodorant.  Wear loose-fitting clothes.  Try not to overheat and get sweaty.  Take a daily bleach bath as directed by your health care provider. ? Fill your bathtub halfway with water. ? Pour in  cup of unscented household bleach. ? Soak for 5-10 minutes.  Cover sore areas with a warm, clean washcloth (compress) for 5-10 minutes. Contact a health care provider if:  You have a flare-up of hidradenitis suppurativa.  You have chills or a fever.  You are having trouble controlling your symptoms at home. This information is not intended to replace advice given to you by your health care provider. Make sure you discuss any questions you have with your health care provider. Document Released: 03/15/2004 Document Revised: 01/07/2016 Document Reviewed: 11/01/2013 Elsevier Interactive Patient Education  2018 [employer].

## 2017-10-05 ENCOUNTER — Telehealth: Payer: Self-pay | Admitting: Family Medicine

## 2017-10-05 ENCOUNTER — Ambulatory Visit: Payer: BLUE CROSS/BLUE SHIELD | Admitting: Family Medicine

## 2017-10-05 ENCOUNTER — Other Ambulatory Visit: Payer: Self-pay

## 2017-10-05 VITALS — BP 120/64 | HR 100 | Temp 99.4°F | Ht 66.0 in | Wt 233.2 lb

## 2017-10-05 DIAGNOSIS — L732 Hidradenitis suppurativa: Secondary | ICD-10-CM

## 2017-10-05 MED ORDER — TRAMADOL HCL 50 MG PO TABS: 50.0000 mg | ORAL_TABLET | Freq: Four times a day (QID) | ORAL | 1 refills | Status: DC | PRN

## 2017-10-05 MED ORDER — DOXYCYCLINE HYCLATE 100 MG PO TABS
100.0000 mg | ORAL_TABLET | Freq: Two times a day (BID) | ORAL | 2 refills | Status: DC
Start: 2017-10-05 — End: 2018-02-09

## 2017-10-05 NOTE — Patient Instructions (Addendum)
1. [facility] ? Address: [address], [place], [place] [address] ? Phone: [phone]         IF you received an x-ray today, you will receive an invoice from [facility]. Please contact [facility] at [phone] with questions or concerns regarding your invoice.   IF you received labwork today, you will receive an invoice from [facility]. Please contact [facility] at [phone] with questions or concerns regarding your invoice.   Our billing staff will not be able to assist you with questions regarding bills from these companies.  You will be contacted with the lab results as soon as they are available. The fastest way to get your results is to activate your My Chart account. Instructions are located on the last page of this paperwork. If you have not heard from us regarding the results in 2 weeks, please contact this office.

## 2017-10-05 NOTE — Telephone Encounter (Signed)
Pt. Would like to know if it is possible to extend her FMLA frequency to 4-5 days out of the month.   Best Number : [phone]

## 2017-10-05 NOTE — Progress Notes (Signed)
2/21/20195:43 PM  Melanie Townsend 19-May-1991, 27 y.o. female [id number]  Chief Complaint  Patient presents with  . Pain    has a boil under the left arm. Seems to come around menstrual under both arms, wants a referral to see a surgeon to get it removed    HPI:   Patient is a 27 y.o. female with past medical history significant for hydranitis who presents today for left axilla boils, very painful.   She was seen yesterday, referred to dermatology, she does have an appt She was seen about a week ago by her pcp and was prescribed doxycycline, never started it, referred to gen surg, has not heard from that referral yet She is left handed, has been doing warm compresses, not using deodorant, washing with chloraprep She is in significant pain, cant really wear a bra or bring down her left arm, she works in a call center She smokes She has never been on long term treatment of her hydranitis as she is mostly seen in the ED when she has a flareup  Depression screen [facility] 2/9 10/05/2017 10/04/2017 09/13/2017  Decreased Interest 0 2 1  Down, Depressed, Hopeless 0 2 1  PHQ - 2 Score 0 4 2  Altered sleeping - 2 -  Tired, decreased energy - 1 -  Change in appetite - 1 -  Feeling bad or failure about yourself  - 1 -  Trouble concentrating - 1 -  Moving slowly or fidgety/restless - 0 -  Suicidal thoughts - 0 -  PHQ-9 Score - 10 -  Difficult doing work/chores - Very difficult -    Allergies  Allergen Reactions  . Iodine     Hives     Prior to Admission medications   Medication Sig Start Date End Date Taking? Authorizing Provider  Chlorhexidine Gluconate 2 % LIQD Apply 1 application topically daily. Use for 1week Patient not taking: Reported on 10/04/2017 09/25/17   Nche, Bonna Gains, NP  NON FORMULARY Apple cyder vinegar tablet OTC    [provider]  NON FORMULARY Zinc OTC    [provider]  Omega-3 Fatty Acids (FISH OIL PO) Take by mouth.    [provider]  phentermine (ADIPEX-P) 37.5 MG tablet TK 1 T PO D 09/11/17   [provider]  Probiotic Product (PROBIOTIC DAILY PO) Take by mouth.    [provider]    No past medical history on file.  Past Surgical History:  Procedure Laterality Date  . pilonidal cyst      Social History   Tobacco Use  . Smoking status: Current Some Day Smoker    Packs/day: 0.50    Types: Cigarettes  . Smokeless tobacco: Never Used  Substance Use Topics  . Alcohol use: Yes    Family History  Problem Relation Age of Onset  . Crohn's disease Father   . Diabetes Maternal Uncle   . Diabetes Maternal Grandmother   . Diabetes Paternal Grandmother     ROS Per hpi  OBJECTIVE:  Blood pressure 120/64, pulse 100, temperature 99.4 F (37.4 C), temperature source Oral, height 5\' 6"  (1.676 m), weight 233 lb 3.2 oz (105.8 kg), last menstrual period 09/18/2017, SpO2 100 %.  Physical Exam Gen; AAOx3, NAD Left axilla: cluster of indurated non fluctuant boils with no erythema, no drainage, very tender even to lightest touch   ASSESSMENT and PLAN  1. Hidradenitis axillaris Had a long conversation of role of abx (anti-inflammtory) on hydranitis, also that  they are meant to be used for longer periods of time than when used for infections. Discussed abx r/se/b. Discussed exacerbating factors such as smoking, weight. Discussed possible use of spironolactone. Offered an intralesional steroid injection but patient declined at this time given how tender the lesion is. Provided information for gen surg, as referral has been sent and patient should be able to call and schedule an appointment. Discussed keeping derm appt, as long term management is not surgical in nature. Discussed that we normally do not provide pain medication for this condition, but that given her current level of discomfort I would provide a small prescription, with the understanding that is an exception. Discussed pain  medication r/se/b.    Other orders - doxycycline (VIBRA-TABS) 100 MG tablet; Take 1 tablet (100 mg total) by mouth 2 (two) times daily. - traMADol (ULTRAM) 50 MG tablet; Take 1 tablet (50 mg total) by mouth every 6 (six) hours as needed.  Return if symptoms worsen or fail to improve.    Myles LippsIrma M Santiago, MD Primary Care at [facility] [address] [place], [place] [address] Ph.  [phone] Fax [phone]

## 2017-10-05 NOTE — Telephone Encounter (Signed)
[facility], Dr. Creta [facility]Stallings didn't have an appointment available to today 10/05/2017 so I set her up an appointment with [place]. She is still wanting to speak with Creta LevinStallings about her arm. Her telephone number on file is correct [phone].

## 2017-10-06 ENCOUNTER — Encounter: Payer: Self-pay | Admitting: Family Medicine

## 2017-10-09 ENCOUNTER — Ambulatory Visit: Payer: BLUE CROSS/BLUE SHIELD | Admitting: Family Medicine

## 2017-10-09 ENCOUNTER — Encounter: Payer: Self-pay | Admitting: Family Medicine

## 2017-10-09 VITALS — BP 120/84 | HR 87 | Resp 17 | Ht 66.0 in | Wt 234.6 lb

## 2017-10-09 DIAGNOSIS — L732 Hidradenitis suppurativa: Secondary | ICD-10-CM

## 2017-10-09 NOTE — Telephone Encounter (Signed)
Pt calling and states her FMLA was denied because her FML[facility] papers were [facility] and pt would like a call back to know why the forms were not sent in. Please contact pt.

## 2017-10-09 NOTE — Progress Notes (Signed)
Chief Complaint  Patient presents with  . Recurrent Skin Infections    left axillae    HPI   She has an appointment with [facility] 10/20/2017 She states that she is taking the antibiotic twice Melanie day  She is taking doxycycline with metronidazole She is using the warm compress for otc  She states that she is also requesting FMLA for her hidradenitis suppurativa which seems to flare up during her period and causes her to miss time from work due to pain as well as feeling feverish. She also has appt with Dermatology 10/25/17     No past medical history on file.  Current Outpatient Medications  Medication Sig Dispense Refill  . Chlorhexidine Gluconate 2 % LIQD Apply 1 application topically daily. Use for 1week (Patient not taking: Reported on 10/04/2017)  0  . doxycycline (VIBRA-TABS) 100 MG tablet Take 1 tablet (100 mg total) by mouth 2 (two) times daily. 60 tablet 2  . NON FORMULARY Apple cyder vinegar tablet OTC    . NON FORMULARY Zinc OTC    . Omega-3 Fatty Acids (FISH OIL PO) Take by mouth.    . phentermine (ADIPEX-P) 37.5 MG tablet TK 1 T PO D  0  . Probiotic Product (PROBIOTIC DAILY PO) Take by mouth.    . traMADol (ULTRAM) 50 MG tablet Take 1 tablet (50 mg total) by mouth every 6 (six) hours as needed. 20 tablet 1   No current facility-administered medications for this visit.     Allergies:  Allergies  Allergen Reactions  . Iodine     Hives     Past Surgical History:  Procedure Laterality Date  . pilonidal cyst      Social History   Socioeconomic History  . Marital status: Single    Spouse name: Not on file  . Number of children: Not on file  . Years of education: Not on file  . Highest education level: Not on file  Social Needs  . Financial resource strain: Not on file  . Food insecurity - worry: Not on file  . Food insecurity - inability: Not on file  . Transportation needs - medical: Not on file  . Transportation needs - non-medical: Not  on file  Occupational History  . Not on file  Tobacco Use  . Smoking status: Current Some Day Smoker    Packs/day: 0.50    Types: Cigarettes  . Smokeless tobacco: Never Used  Substance and Sexual Activity  . Alcohol use: Yes  . Drug use: No  . Sexual activity: Yes    Birth control/protection: None  Other Topics Concern  . Not on file  Social History Narrative  . Not on file    Family History  Problem Relation Age of Onset  . Crohn's disease Father   . Diabetes Maternal Uncle   . Diabetes Maternal Grandmother   . Diabetes Paternal Grandmother      ROS Review of Systems See HPI Constitution: No fevers or chills No malaise No diaphoresis Skin: No rash or itching Eyes: no blurry vision, no double vision GU: no dysuria or hematuria Neuro: no dizziness or headaches all others reviewed and negative   Objective: There were no vitals filed for this visit.  Physical Exam  Constitutional: She is oriented to person, place, and time. She appears well-developed and well-nourished.  HENT:  Head: Normocephalic and atraumatic.  Pulmonary/Chest: Effort normal.  Neurological: She is alert and oriented to person, place, and time.   Skin:  left axillae with tenderness to palpation  Fluctuance noted  Nodule about 2cmx1cm No purulent drainage expressed   Assessment and Plan Melanie Townsend was seen today for recurrent skin infections.  Diagnoses and all orders for this visit:  Hidradenitis axillaris Return to clinic as needed Return FMLA paperwork to give her time for office visits  Follow up with [facility]   Other orders -     Tdap vaccine greater than or equal to 7yo IM      Melanie Townsend Melanie Townsend

## 2017-10-10 ENCOUNTER — Telehealth: Payer: Self-pay | Admitting: Family Medicine

## 2017-10-10 ENCOUNTER — Encounter: Payer: Self-pay | Admitting: Nurse Practitioner

## 2017-10-10 NOTE — Telephone Encounter (Signed)
Called & spoke with patient regarding FMLA paperwork. Patient verbalized that since her last office visit with Alysia Penn[facility]arlotte Nche, NP, she has established care with [facility], also stating that "it's closer to home for her". Patient addressed that current PCP has agreed to fill out FMLA paperwork, but not for the date she was seen here at this office or the following day she missed from work. Therefore, she's inquiring about FMLA for those two dates. RN made patient aware that, per Alysia Pennaharlotte Nche, NP she will provide a letter, but only for the date of last office visit. Patient expressed dismay of the information given, but agreed to pickup letter today.

## 2017-10-10 NOTE — Telephone Encounter (Signed)
Patient brought in FMLA forms to be completed by Dr Creta [facility]Stallings for her abscess. I completed what I could from the OV notes and highlighted the areas I was not sure about. I will place the forms in Dr Creta LevinStallings box on 10/10/17 please return to the FMLA/Disability desk within 5-7 business days. Thank you

## 2017-10-10 NOTE — Telephone Encounter (Signed)
Advise pt that we never got the FMLA, pt stated she will have them fax over to us to fill out. Pt stated she still wants those 2 days a month [facility]. Please help follow up on this.

## 2017-10-13 ENCOUNTER — Encounter: Payer: Self-pay | Admitting: Family Medicine

## 2017-10-18 NOTE — Telephone Encounter (Signed)
Paperwork scanned and faxed on 10/18/17

## 2017-10-18 NOTE — Telephone Encounter (Signed)
[facility]

## 2017-10-20 DIAGNOSIS — L732 Hidradenitis suppurativa: Secondary | ICD-10-CM | POA: Diagnosis not present

## 2017-10-25 DIAGNOSIS — L732 Hidradenitis suppurativa: Secondary | ICD-10-CM | POA: Diagnosis not present

## 2017-10-31 DIAGNOSIS — Z0271 Encounter for disability determination: Secondary | ICD-10-CM

## 2017-11-07 DIAGNOSIS — N898 Other specified noninflammatory disorders of vagina: Secondary | ICD-10-CM | POA: Diagnosis not present

## 2017-11-10 DIAGNOSIS — E6609 Other obesity due to excess calories: Secondary | ICD-10-CM | POA: Insufficient documentation

## 2017-11-10 DIAGNOSIS — Z6838 Body mass index (BMI) 38.0-38.9, adult: Secondary | ICD-10-CM | POA: Diagnosis not present

## 2017-11-10 DIAGNOSIS — L732 Hidradenitis suppurativa: Secondary | ICD-10-CM | POA: Diagnosis not present

## 2017-11-15 DIAGNOSIS — K644 Residual hemorrhoidal skin tags: Secondary | ICD-10-CM | POA: Diagnosis not present

## 2017-11-20 DIAGNOSIS — F3340 Major depressive disorder, recurrent, in remission, unspecified: Secondary | ICD-10-CM | POA: Diagnosis not present

## 2017-11-20 DIAGNOSIS — F12180 Cannabis abuse with cannabis-induced anxiety disorder: Secondary | ICD-10-CM | POA: Diagnosis not present

## 2017-12-27 DIAGNOSIS — R4586 Emotional lability: Secondary | ICD-10-CM | POA: Diagnosis not present

## 2017-12-27 DIAGNOSIS — F320 Major depressive disorder, single episode, mild: Secondary | ICD-10-CM | POA: Diagnosis not present

## 2017-12-27 DIAGNOSIS — N898 Other specified noninflammatory disorders of vagina: Secondary | ICD-10-CM | POA: Diagnosis not present

## 2017-12-27 DIAGNOSIS — Z113 Encounter for screening for infections with a predominantly sexual mode of transmission: Secondary | ICD-10-CM | POA: Diagnosis not present

## 2018-01-26 DIAGNOSIS — F3281 Premenstrual dysphoric disorder: Secondary | ICD-10-CM | POA: Diagnosis not present

## 2018-01-26 DIAGNOSIS — F320 Major depressive disorder, single episode, mild: Secondary | ICD-10-CM | POA: Diagnosis not present

## 2018-02-05 DIAGNOSIS — F3340 Major depressive disorder, recurrent, in remission, unspecified: Secondary | ICD-10-CM | POA: Diagnosis not present

## 2018-02-05 DIAGNOSIS — F12180 Cannabis abuse with cannabis-induced anxiety disorder: Secondary | ICD-10-CM | POA: Diagnosis not present

## 2018-02-07 DIAGNOSIS — L732 Hidradenitis suppurativa: Secondary | ICD-10-CM | POA: Diagnosis not present

## 2018-02-09 ENCOUNTER — Encounter (HOSPITAL_BASED_OUTPATIENT_CLINIC_OR_DEPARTMENT_OTHER): Payer: Self-pay | Admitting: *Deleted

## 2018-02-26 ENCOUNTER — Encounter (HOSPITAL_BASED_OUTPATIENT_CLINIC_OR_DEPARTMENT_OTHER): Payer: Self-pay | Admitting: Anesthesiology

## 2018-02-26 ENCOUNTER — Encounter (HOSPITAL_BASED_OUTPATIENT_CLINIC_OR_DEPARTMENT_OTHER): Payer: Self-pay

## 2018-02-26 ENCOUNTER — Encounter (HOSPITAL_BASED_OUTPATIENT_CLINIC_OR_DEPARTMENT_OTHER)
Admission: RE | Disposition: A | Discharge status: Home / Self Care | Payer: Self-pay | Source: Ambulatory Visit | Attending: Specialist

## 2018-02-26 ENCOUNTER — Other Ambulatory Visit: Payer: Self-pay

## 2018-02-26 ENCOUNTER — Ambulatory Visit (HOSPITAL_BASED_OUTPATIENT_CLINIC_OR_DEPARTMENT_OTHER)
Admission: RE | Admit: 2018-02-26 | Discharge: 2018-02-26 | Disposition: A | Discharge status: Home / Self Care | Payer: BLUE CROSS/BLUE SHIELD | Source: Ambulatory Visit | Attending: Specialist | Admitting: Specialist

## 2018-02-26 DIAGNOSIS — L732 Hidradenitis suppurativa: Secondary | ICD-10-CM | POA: Diagnosis not present

## 2018-02-26 DIAGNOSIS — F329 Major depressive disorder, single episode, unspecified: Secondary | ICD-10-CM | POA: Insufficient documentation

## 2018-02-26 DIAGNOSIS — F17210 Nicotine dependence, cigarettes, uncomplicated: Secondary | ICD-10-CM | POA: Insufficient documentation

## 2018-02-26 DIAGNOSIS — Z79899 Other long term (current) drug therapy: Secondary | ICD-10-CM | POA: Diagnosis not present

## 2018-02-26 HISTORY — PX: HYDRADENITIS EXCISION: SHX5243

## 2018-02-26 SURGERY — EXCISION, HIDRADENITIS, AXILLA
Anesthesia: General | Site: Axilla | Laterality: Left

## 2018-02-26 MED ORDER — LIDOCAINE HCL (CARDIAC) PF 100 MG/5ML IV SOSY
PREFILLED_SYRINGE | INTRAVENOUS | Status: AC
  Filled 2018-02-26: qty 5

## 2018-02-26 MED ORDER — FENTANYL CITRATE (PF) 100 MCG/2ML IJ SOLN
INTRAMUSCULAR | Status: AC
  Filled 2018-02-26: qty 2

## 2018-02-26 MED ORDER — OXYCODONE HCL 5 MG/5ML PO SOLN: 5.0000 mg | Freq: Once | ORAL | Status: AC | PRN

## 2018-02-26 MED ORDER — FENTANYL CITRATE (PF) 100 MCG/2ML IJ SOLN
50.0000 ug | INTRAMUSCULAR | Status: DC | PRN
  Administered 2018-02-26: 100 ug via INTRAVENOUS

## 2018-02-26 MED ORDER — MEPERIDINE HCL 25 MG/ML IJ SOLN: 6.2500 mg | INTRAMUSCULAR | Status: DC | PRN

## 2018-02-26 MED ORDER — PROPOFOL 10 MG/ML IV BOLUS
INTRAVENOUS | Status: DC | PRN
  Administered 2018-02-26: 250 mg via INTRAVENOUS

## 2018-02-26 MED ORDER — SCOPOLAMINE 1 MG/3DAYS TD PT72: 1.0000 | MEDICATED_PATCH | Freq: Once | TRANSDERMAL | Status: DC | PRN

## 2018-02-26 MED ORDER — DEXAMETHASONE SODIUM PHOSPHATE 10 MG/ML IJ SOLN
INTRAMUSCULAR | Status: DC | PRN
  Administered 2018-02-26: 10 mg via INTRAVENOUS

## 2018-02-26 MED ORDER — LIDOCAINE-EPINEPHRINE 0.5 %-1:200000 IJ SOLN
INTRAMUSCULAR | Status: DC | PRN
  Administered 2018-02-26: 100 mL

## 2018-02-26 MED ORDER — CEFAZOLIN SODIUM-DEXTROSE 2-4 GM/100ML-% IV SOLN: 2.0000 g | INTRAVENOUS | Status: DC

## 2018-02-26 MED ORDER — CHLORHEXIDINE GLUCONATE CLOTH 2 % EX PADS: 6.0000 | MEDICATED_PAD | Freq: Once | CUTANEOUS | Status: DC

## 2018-02-26 MED ORDER — OXYCODONE HCL 5 MG PO TABS
ORAL_TABLET | ORAL | Status: AC
Start: 2018-02-26 — End: ?
  Filled 2018-02-26: qty 1

## 2018-02-26 MED ORDER — DEXAMETHASONE SODIUM PHOSPHATE 10 MG/ML IJ SOLN
INTRAMUSCULAR | Status: AC
Start: 2018-02-26 — End: ?
  Filled 2018-02-26: qty 1

## 2018-02-26 MED ORDER — CEFAZOLIN SODIUM-DEXTROSE 2-4 GM/100ML-% IV SOLN
2.0000 g | INTRAVENOUS | Status: AC
  Administered 2018-02-26: 2 g via INTRAVENOUS

## 2018-02-26 MED ORDER — LIDOCAINE 2% (20 MG/ML) 5 ML SYRINGE
INTRAMUSCULAR | Status: DC | PRN
  Administered 2018-02-26: 100 mg via INTRAVENOUS

## 2018-02-26 MED ORDER — LACTATED RINGERS IV SOLN
INTRAVENOUS | Status: DC
  Administered 2018-02-26 (×2): via INTRAVENOUS

## 2018-02-26 MED ORDER — PROMETHAZINE HCL 25 MG/ML IJ SOLN: 6.2500 mg | INTRAMUSCULAR | Status: DC | PRN

## 2018-02-26 MED ORDER — CEFAZOLIN SODIUM-DEXTROSE 2-4 GM/100ML-% IV SOLN
INTRAVENOUS | Status: AC
Start: 2018-02-26 — End: ?
  Filled 2018-02-26: qty 100

## 2018-02-26 MED ORDER — HYDROMORPHONE HCL 1 MG/ML IJ SOLN: 0.2500 mg | INTRAMUSCULAR | Status: DC | PRN

## 2018-02-26 MED ORDER — MIDAZOLAM HCL 2 MG/2ML IJ SOLN
1.0000 mg | INTRAMUSCULAR | Status: DC | PRN
  Administered 2018-02-26: 2 mg via INTRAVENOUS

## 2018-02-26 MED ORDER — ONDANSETRON HCL 4 MG/2ML IJ SOLN
INTRAMUSCULAR | Status: AC
  Filled 2018-02-26: qty 2

## 2018-02-26 MED ORDER — OXYCODONE HCL 5 MG PO TABS
5.0000 mg | ORAL_TABLET | Freq: Once | ORAL | Status: AC | PRN
  Administered 2018-02-26: 5 mg via ORAL

## 2018-02-26 MED ORDER — MIDAZOLAM HCL 2 MG/2ML IJ SOLN
INTRAMUSCULAR | Status: AC
  Filled 2018-02-26: qty 2

## 2018-02-26 SURGICAL SUPPLY — 58 items
BAG DECANTER FOR FLEXI CONT (MISCELLANEOUS) ×2 IMPLANT
BENZOIN TINCTURE PRP APPL 2/3 (GAUZE/BANDAGES/DRESSINGS) ×2 IMPLANT
BLADE KNIFE PERSONA 10 (BLADE) ×2 IMPLANT
BLADE KNIFE PERSONA 15 (BLADE) IMPLANT
BNDG COHESIVE 4X5 TAN STRL (GAUZE/BANDAGES/DRESSINGS) ×2 IMPLANT
BRIEF STRETCH FOR OB PAD XXL (UNDERPADS AND DIAPERS) IMPLANT
CANISTER SUCT 1200ML W/VALVE (MISCELLANEOUS) ×2 IMPLANT
COVER BACK TABLE 60X90IN (DRAPES) ×2 IMPLANT
COVER MAYO STAND STRL (DRAPES) ×2 IMPLANT
DECANTER SPIKE VIAL GLASS SM (MISCELLANEOUS) IMPLANT
DRAIN CHANNEL 10M FLAT 3/4 FLT (DRAIN) IMPLANT
DRAIN CHANNEL 7F FF FLAT (WOUND CARE) IMPLANT
DRAPE U-SHAPE 76X120 STRL (DRAPES) IMPLANT
DRSG PAD ABDOMINAL 8X10 ST (GAUZE/BANDAGES/DRESSINGS) ×2 IMPLANT
DRSG TELFA 3X8 NADH (GAUZE/BANDAGES/DRESSINGS) ×2 IMPLANT
ELECT REM PT RETURN 9FT ADLT (ELECTROSURGICAL) ×2
ELECTRODE REM PT RTRN 9FT ADLT (ELECTROSURGICAL) ×1 IMPLANT
EVACUATOR SILICONE 100CC (DRAIN) IMPLANT
GAUZE SPONGE 4X4 12PLY STRL (GAUZE/BANDAGES/DRESSINGS) ×2 IMPLANT
GAUZE XEROFORM 5X9 LF (GAUZE/BANDAGES/DRESSINGS) ×2 IMPLANT
GLOVE BIO SURGEON STRL SZ 6.5 (GLOVE) ×2 IMPLANT
GLOVE BIOGEL M STRL SZ7.5 (GLOVE) ×2 IMPLANT
GLOVE BIOGEL PI IND STRL 8 (GLOVE) ×1 IMPLANT
GLOVE BIOGEL PI INDICATOR 8 (GLOVE) ×1
GLOVE ECLIPSE 7.0 STRL STRAW (GLOVE) ×2 IMPLANT
GOWN STRL REUS W/ TWL XL LVL3 (GOWN DISPOSABLE) ×2 IMPLANT
GOWN STRL REUS W/TWL XL LVL3 (GOWN DISPOSABLE) ×2
IV NS 500ML (IV SOLUTION) ×1
IV NS 500ML BAXH (IV SOLUTION) ×1 IMPLANT
MARKER SKIN DUAL TIP RULER LAB (MISCELLANEOUS) ×2 IMPLANT
NDL SAFETY ECLIPSE 18X1.5 (NEEDLE) IMPLANT
NEEDLE HYPO 18GX1.5 SHARP (NEEDLE)
NEEDLE HYPO 25X1 1.5 SAFETY (NEEDLE) IMPLANT
NEEDLE SPNL 18GX3.5 QUINCKE PK (NEEDLE) IMPLANT
NS IRRIG 1000ML POUR BTL (IV SOLUTION) ×2 IMPLANT
PACK BASIN DAY SURGERY FS (CUSTOM PROCEDURE TRAY) ×2 IMPLANT
PEN SKIN MARKING BROAD TIP (MISCELLANEOUS) IMPLANT
PIN SAFETY STERILE (MISCELLANEOUS) IMPLANT
SHEET MEDIUM DRAPE 40X70 STRL (DRAPES) ×2 IMPLANT
SLEEVE SCD COMPRESS KNEE MED (MISCELLANEOUS) ×2 IMPLANT
SPONGE LAP 18X18 RF (DISPOSABLE) ×4 IMPLANT
STAPLER VISISTAT 35W (STAPLE) IMPLANT
STOCKINETTE IMPERVIOUS LG (DRAPES) IMPLANT
STRIP SUTURE WOUND CLOSURE 1/2 (SUTURE) ×2 IMPLANT
SUT ETHILON 3 0 FSL (SUTURE) IMPLANT
SUT MNCRL AB 3-0 PS2 18 (SUTURE) ×2 IMPLANT
SUT MON AB 2-0 CT1 36 (SUTURE) ×2 IMPLANT
SUT PROLENE 4 0 P 3 18 (SUTURE) IMPLANT
SYR 20CC LL (SYRINGE) IMPLANT
SYR 50ML LL SCALE MARK (SYRINGE) ×4 IMPLANT
SYR CONTROL 10ML LL (SYRINGE) IMPLANT
SYR TB 1ML LL NO SAFETY (SYRINGE) IMPLANT
TOWEL GREEN STERILE FF (TOWEL DISPOSABLE) ×6 IMPLANT
TRAY DSU PREP LF (CUSTOM PROCEDURE TRAY) ×2 IMPLANT
TUBE CONNECTING 20X1/4 (TUBING) ×2 IMPLANT
UNDERPAD 30X30 (UNDERPADS AND DIAPERS) ×2 IMPLANT
VAC PENCILS W/TUBING CLEAR (MISCELLANEOUS) ×2 IMPLANT
YANKAUER SUCT BULB TIP NO VENT (SUCTIONS) ×2 IMPLANT

## 2018-02-26 NOTE — Anesthesia Procedure Notes (Signed)
Procedure Name: LMA Insertion Date/Time: 02/26/2018 10:47 AM Performed by: Gar GibbonKeeton, Jhoel Stieg S, CRNA Pre-anesthesia Checklist: Patient identified, Emergency Drugs available, Suction available and Patient being monitored Patient Re-evaluated:Patient Re-evaluated prior to induction Oxygen Delivery Method: Circle system utilized Preoxygenation: Pre-oxygenation with 100% oxygen Induction Type: IV induction Ventilation: Mask ventilation without difficulty LMA: LMA inserted LMA Size: 4.0 Number of attempts: 1 Airway Equipment and Method: Bite block Placement Confirmation: positive ETCO2 Tube secured with: Tape Dental Injury: Teeth and Oropharynx as per pre-operative assessment

## 2018-02-26 NOTE — Anesthesia Preprocedure Evaluation (Addendum)
Anesthesia Evaluation  Patient identified by MRN, date of birth, ID band Patient awake    Reviewed: Allergy & Precautions, NPO status , Patient's Chart, lab work & pertinent test results  Airway Mallampati: II  TM Distance: >3 FB Neck ROM: Full    Dental no notable dental hx.    Pulmonary neg pulmonary ROS, Current Smoker,    Pulmonary exam normal breath sounds clear to auscultation       Cardiovascular negative cardio ROS Normal cardiovascular exam Rhythm:Regular Rate:Normal     Neuro/Psych negative neurological ROS  negative psych ROS   GI/Hepatic negative GI ROS, Neg liver ROS,   Endo/Other  negative endocrine ROS  Renal/GU negative Renal ROS     Musculoskeletal negative musculoskeletal ROS (+)   Abdominal (+) + obese,   Peds  Hematology negative hematology ROS (+)   Anesthesia Other Findings   Reproductive/Obstetrics negative OB ROS                             Anesthesia Physical Anesthesia Plan  ASA: II  Anesthesia Plan: General   Post-op Pain Management:    Induction: Intravenous  PONV Risk Score and Plan: 3 and Ondansetron, Dexamethasone, Midazolam and Treatment may vary due to age or medical condition  Airway Management Planned: LMA  Additional Equipment:   Intra-op Plan:   Post-operative Plan: Extubation in OR  Informed Consent: I have reviewed the patients History and Physical, chart, labs and discussed the procedure including the risks, benefits and alternatives for the proposed anesthesia with the patient or authorized representative who has indicated his/her understanding and acceptance.   Dental advisory given  Plan Discussed with: CRNA  Anesthesia Plan Comments:         Anesthesia Quick Evaluation

## 2018-02-26 NOTE — Transfer of Care (Signed)
Immediate Anesthesia Transfer of Care Note  Patient: Melanie Townsend  Procedure(s) Performed: EXCISION HIDRADENITIS TO THE LEFT AXILLA (Left Axilla)  Patient Location: PACU  Anesthesia Type:General  Level of Consciousness: awake and confused  Airway & Oxygen Therapy: Patient Spontanous Breathing  Post-op Assessment: Report given to RN and Post -op Vital signs reviewed and stable  Post vital signs: Reviewed and stable  Last Vitals:  Vitals Value Taken Time  BP 95/80 02/26/2018 11:46 AM  Temp    Pulse 93 02/26/2018 11:51 AM  Resp 17 02/26/2018 11:51 AM  SpO2 100 % 02/26/2018 11:51 AM  Vitals shown include unvalidated device data.  Last Pain:  Vitals:   02/26/18 0929  TempSrc: Oral         Complications: No apparent anesthesia complications

## 2018-02-26 NOTE — Discharge Instructions (Signed)
°  Post Anesthesia Home Care Instructions ° °Activity: °Get plenty of rest for the remainder of the day. A responsible individual must stay with you for 24 hours following the procedure.  °For the next 24 hours, DO NOT: °-Drive a car °-Operate machinery °-Drink alcoholic beverages °-Take any medication unless instructed by your physician °-Make any legal decisions or sign important papers. ° °Meals: °Start with liquid foods such as gelatin or soup. Progress to regular foods as tolerated. Avoid greasy, spicy, heavy foods. If nausea and/or vomiting occur, drink only clear liquids until the nausea and/or vomiting subsides. Call your physician if vomiting continues. ° °Special Instructions/Symptoms: °Your throat may feel dry or sore from the anesthesia or the breathing tube placed in your throat during surgery. If this causes discomfort, gargle with warm salt water. The discomfort should disappear within 24 hours. ° °If you had a scopolamine patch placed behind your ear for the management of post- operative nausea and/or vomiting: ° °1. The medication in the patch is effective for 72 hours, after which it should be removed.  Wrap patch in a tissue and discard in the trash. Wash hands thoroughly with soap and water. °2. You may remove the patch earlier than 72 hours if you experience unpleasant side effects which may include dry mouth, dizziness or visual disturbances. °3. Avoid touching the patch. Wash your hands with soap and water after contact with the patch. °  ° ° ° °Activity (include date of return to work if known) °As tolerated: NO showers °NO driving °No heavy activities ° °Diet:regular No restrictions: ° °Wound Care: Keep dressing clean & dry ° °Do not change dressings °For Abdominoplasties wear abdominal binder °Special Instructions: °Do not raise arms up °Continue to empty, recharge, & record drainage from J-P drains &/or °Hemovacs 2-3 times a day, as needed. °Call Doctor if any unusual problems occur such as  pain, excessive °Bleeding, unrelieved Nausea/vomiting, Fever &/or chills °When lying down, keep head elevated on 2-3 pillows or back-rest °For Addominoplasties the Jack-knife position °Follow-up appointment: Please call the office. ° °The patient received discharge instruction from:___________________________________________ ° ° °Patient signature ________________________________________ / Date___________ ° ° ° °Signature of individual providing instructions/ Date________________             °

## 2018-02-26 NOTE — H&P (Signed)
Melanie Townsend is an 27 y.o. female.   Chief Complaint: Hidraidnitis left axilla HPI: Increased flareups and drainage and pain  Past Medical History:  Diagnosis Date  . Depression     Past Surgical History:  Procedure Laterality Date  . pilonidal cyst      Family History  Problem Relation Age of Onset  . Crohn's disease Father   . Diabetes Maternal Uncle   . Diabetes Maternal Grandmother   . Diabetes Paternal Grandmother    Social History:  reports that she has been smoking cigarettes.  She has been smoking about 0.50 packs per day. She has never used smokeless tobacco. She reports that she drinks alcohol. She reports that she does not use drugs.  Allergies:  Allergies  Allergen Reactions  . Iodine     Hives     Medications Prior to Admission  Medication Sig Dispense Refill  . Boric Acid (HM BORIC ACID) POWD Place rectally.      No results found for this or any previous visit (from the past 48 hour(s)). No results found.  Review of Systems  Constitutional: Negative.   HENT: Negative.   Eyes: Negative.   Respiratory: Negative.   Cardiovascular: Negative.   Gastrointestinal: Negative.   Genitourinary: Negative.   Musculoskeletal: Negative.   Skin: Negative.   Neurological: Negative.   Endo/Heme/Allergies: Negative.   Psychiatric/Behavioral: Negative.     Blood pressure 110/71, pulse 64, temperature 98.3 F (36.8 C), temperature source Oral, resp. rate 18, height 5\' 6"  (1.676 m), weight 107.5 kg (237 lb), last menstrual period 02/05/2018, SpO2 100 %. Physical Exam  Constitutional: She is oriented to person, place, and time. She appears well-developed.  HENT:  Head: Normocephalic.  Eyes: Pupils are equal, round, and reactive to light. EOM are normal.  Neck: Normal range of motion. Neck supple. No tracheal deviation present. No thyromegaly present.  Cardiovascular: Normal rate and regular rhythm.  Respiratory: Effort normal and breath sounds normal.  No respiratory distress. She has no wheezes.  GI: Soft. Bowel sounds are normal. She exhibits no distension and no mass. There is no tenderness.  Musculoskeletal: Normal range of motion.  Neurological: She is alert and oriented to person, place, and time. She has normal reflexes.  Skin: Skin is warm. There is erythema.  Psychiatric: She has a normal mood and affect. Her behavior is normal.   Increased hidraidnitis left axilla with purulence  Assessment/Plan Hidraidnitis left axilla for excision and Estill Battenyan Pollack flap closure  Donice Alperin L, MD 02/26/2018, 10:17 AM

## 2018-02-26 NOTE — Op Note (Signed)
NAMDonzetta Starch: Townsend, Melanie MEDICAL RECORD [id number] ACCOUNT [id number] DATE OF BIRTH:09/20/1990 FACILITY: [facility] LOCATION: [facility] PHYSICIAN:Renardo Cheatum Preston FleetingL. Moesha Sarchet, MD  OPERATIVE REPORT  DATE OF PROCEDURE:  02/26/2018  This is a [age] year old lady with severe hidradenitis suppurativa involving her left axilla.  The patient has had multiple I and Ds, over 9-10 in the past, to rid of severe furuncles and abscesses of the left axilla, increased pain, discomfort, fever and  chills.  PROCEDURE:  Planned excision of hidradenitis of left axilla with all the hair-bearing area with Ryan-Pollack flap closure complex.   SURGEON:  Louisa SecondGerald Pier Laux, MD  ANESTHESIA:  General.  PREOPERATIVE DESCRIPTION OF PROCEDURE:  The patient underwent drawings of the large elliptical area to be removed.  She then underwent general anesthesia and intubated orally.  Prep was done to the left axillary areas and chest breast areas using  Hibiclens soap and solution walled off with sterile towels and drapes so as to make a sterile field.  Xylocaine 0.5% with epinephrine injected locally, a total of 150 mL 1:200,000 concentration.  The wound was then excised using the Bovie on cutting with  the large elliptical area down to the superficial fascia.  Next, the tissue was taken all the way across.  Hemostasis was maintained with the Bovie on coagulation until the fat was totally removed.  After this, the medial and lateral flaps were freed up  approximately 8.5-9 cm to allow closure without tension using a Ryan-Pollack closure using deep sutures of 2-0 Monocryl x2 layers, a running subcutaneous suture of 3-0 Monocryl and then a running subcuticular stitch of 3-0 Monocryl and a running 3-0  nylon just for support.  The wounds were cleansed.  They were then dressed with half-inch Steri-Strips and soft bulky dressings.  She tolerated the procedures very well and a sterile dressing applied and she was then taken to recovery in  excellent  condition.  ESTIMATED BLOOD LOSS:  Less than 100.  COMPLICATIONS:  None.  TN/NUANCE  D:02/26/2018 T:02/26/2018 JOB:[id number]

## 2018-02-26 NOTE — Anesthesia Postprocedure Evaluation (Signed)
Anesthesia Post Note  Patient: Melanie Townsend  Procedure(s) Performed: EXCISION HIDRADENITIS TO THE LEFT AXILLA (Left Axilla)     Patient location during evaluation: PACU Anesthesia Type: General Level of consciousness: awake and alert and oriented Pain management: pain level controlled Vital Signs Assessment: post-procedure vital signs reviewed and stable Respiratory status: spontaneous breathing, nonlabored ventilation and respiratory function stable Cardiovascular status: blood pressure returned to baseline and stable Postop Assessment: no apparent nausea or vomiting Anesthetic complications: no    Last Vitals:  Vitals:   02/26/18 1200 02/26/18 1215  BP: 115/76 112/72  Pulse: 81   Resp: 16 19  Temp:    SpO2: 100%     Last Pain:  Vitals:   02/26/18 1200  TempSrc:   PainSc: 5                  Chereese Cilento A.

## 2018-02-27 ENCOUNTER — Encounter (HOSPITAL_BASED_OUTPATIENT_CLINIC_OR_DEPARTMENT_OTHER): Payer: Self-pay | Admitting: Specialist

## 2018-03-08 DIAGNOSIS — F3340 Major depressive disorder, recurrent, in remission, unspecified: Secondary | ICD-10-CM | POA: Diagnosis not present

## 2018-03-08 DIAGNOSIS — F12180 Cannabis abuse with cannabis-induced anxiety disorder: Secondary | ICD-10-CM | POA: Diagnosis not present

## 2018-03-09 DIAGNOSIS — N898 Other specified noninflammatory disorders of vagina: Secondary | ICD-10-CM | POA: Diagnosis not present

## 2018-03-09 DIAGNOSIS — F418 Other specified anxiety disorders: Secondary | ICD-10-CM | POA: Diagnosis not present

## 2018-03-09 DIAGNOSIS — B009 Herpesviral infection, unspecified: Secondary | ICD-10-CM | POA: Diagnosis not present

## 2018-04-04 DIAGNOSIS — L732 Hidradenitis suppurativa: Secondary | ICD-10-CM | POA: Diagnosis not present

## 2018-04-06 DIAGNOSIS — F3340 Major depressive disorder, recurrent, in remission, unspecified: Secondary | ICD-10-CM | POA: Diagnosis not present

## 2018-04-06 DIAGNOSIS — F12180 Cannabis abuse with cannabis-induced anxiety disorder: Secondary | ICD-10-CM | POA: Diagnosis not present

## 2018-04-09 ENCOUNTER — Other Ambulatory Visit: Payer: Self-pay

## 2018-04-09 ENCOUNTER — Encounter: Payer: Self-pay | Admitting: Family Medicine

## 2018-04-09 ENCOUNTER — Ambulatory Visit: Payer: BLUE CROSS/BLUE SHIELD | Admitting: Family Medicine

## 2018-04-09 VITALS — BP 120/80 | HR 78 | Temp 99.0°F | Resp 16 | Ht 66.0 in | Wt 242.4 lb

## 2018-04-09 DIAGNOSIS — J020 Streptococcal pharyngitis: Secondary | ICD-10-CM

## 2018-04-09 DIAGNOSIS — J029 Acute pharyngitis, unspecified: Secondary | ICD-10-CM | POA: Diagnosis not present

## 2018-04-09 LAB — POCT RAPID STREP A (OFFICE): Rapid Strep A Screen: NEGATIVE

## 2018-04-09 MED ORDER — FLUCONAZOLE 150 MG PO TABS: 150.0000 mg | ORAL_TABLET | Freq: Once | ORAL | 1 refills | Status: AC

## 2018-04-09 MED ORDER — AMOXICILLIN 500 MG PO CAPS: 500.0000 mg | ORAL_CAPSULE | Freq: Two times a day (BID) | ORAL | 0 refills | Status: AC

## 2018-04-09 NOTE — Patient Instructions (Addendum)
If you have lab work done today you will be contacted with your lab results within the next 2 weeks.  If you have not heard from [place] then please contact us. The fastest way to get your results is to register for My Chart.   IF you received an x-ray today, you will receive an invoice from [facility]. Please contact [facility] at [phone] with questions or concerns regarding your invoice.   IF you received labwork today, you will receive an invoice from [facility]. Please contact [facility] at [phone] with questions or concerns regarding your invoice.   Our billing staff will not be able to assist you with questions regarding bills from these companies.  You will be contacted with the lab results as soon as they are available. The fastest way to get your results is to activate your My Chart account. Instructions are located on the last page of this paperwork. If you have not heard from [place] regarding the results in 2 weeks, please contact this office.     Strep Throat Strep throat is a bacterial infection of the throat. Your health care provider may call the infection tonsillitis or pharyngitis, depending on whether there is swelling in the tonsils or at the back of the throat. Strep throat is most common during the cold months of the year in children who are 58-79 years of age, but it can happen during any season in people of any age. This infection is spread from person to person (contagious) through coughing, sneezing, or close contact. What are the causes? Strep throat is caused by the bacteria called Streptococcus pyogenes. What increases the risk? This condition is more likely to develop in:  People who spend time in crowded places where the infection can spread easily.  People who have close contact with someone who has strep throat.  What are the signs or symptoms? Symptoms of this condition include:  Fever or chills.  Redness, swelling, or pain in the  tonsils or throat.  Pain or difficulty when swallowing.  White or yellow spots on the tonsils or throat.  Swollen, tender glands in the neck or under the jaw.  Red rash all over the body (rare).  How is this diagnosed? This condition is diagnosed by performing a rapid strep test or by taking a swab of your throat (throat culture test). Results from a rapid strep test are usually ready in a few minutes, but throat culture test results are available after one or two days. How is this treated? This condition is treated with antibiotic medicine. Follow these instructions at home: Medicines  Take over-the-counter and prescription medicines only as told by your health care provider.  Take your antibiotic as told by your health care provider. Do not stop taking the antibiotic even if you start to feel better.  Have family members who also have a sore throat or fever tested for strep throat. They may need antibiotics if they have the strep infection. Eating and drinking  Do not share food, drinking cups, or personal items that could cause the infection to spread to other people.  If swallowing is difficult, try eating soft foods until your sore throat feels better.  Drink enough fluid to keep your urine clear or pale yellow. General instructions  Gargle with a salt-water mixture 3-4 times per day or as needed. To make a salt-water mixture, completely dissolve -1 tsp of salt in 1 cup of warm water.  Make sure that all  household members wash their hands well.  Get plenty of rest.  Stay home from school or work until you have been taking antibiotics for 24 hours.  Keep all follow-up visits as told by your health care provider. This is important. Contact a health care provider if:  The glands in your neck continue to get bigger.  You develop a rash, cough, or earache.  You cough up a thick liquid that is green, yellow-brown, or bloody.  You have pain or discomfort that does not  get better with medicine.  Your problems seem to be getting worse rather than better.  You have a fever. Get help right away if:  You have new symptoms, such as vomiting, severe headache, stiff or painful neck, chest pain, or shortness of breath.  You have severe throat pain, drooling, or changes in your voice.  You have swelling of the neck, or the skin on the neck becomes red and tender.  You have signs of dehydration, such as fatigue, dry mouth, and decreased urination.  You become increasingly sleepy, or you cannot wake up completely.  Your joints become red or painful. This information is not intended to replace advice given to you by your health care provider. Make sure you discuss any questions you have with your health care provider. Document Released: 07/29/2000 Document Revised: 03/30/2016 Document Reviewed: 11/24/2014 Elsevier Interactive Patient Education  [employer].

## 2018-04-09 NOTE — Progress Notes (Signed)
Chief Complaint  Patient presents with  . Sore Throat    x couple days with ha's and is lg temp    HPI  Pt reports sore throat With headaches She is also having fevers and chills She is having pain with swallowing She has myalgias  Past Medical History:  Diagnosis Date  . Depression     Current Outpatient Medications  Medication Sig Dispense Refill  . Boric Acid (HM BORIC ACID) POWD Place rectally.     No current facility-administered medications for this visit.     Allergies:  Allergies  Allergen Reactions  . Iodine     Hives     Past Surgical History:  Procedure Laterality Date  . HYDRADENITIS EXCISION Left 02/26/2018   Procedure: EXCISION HIDRADENITIS TO THE LEFT AXILLA;  Surgeon: Louisa Secondruesdale, Gerald, MD;  Location: [facility];  Service: Plastics;  Laterality: Left;  . pilonidal cyst      Social History   Socioeconomic History  . Marital status: Single    Spouse name: Not on file  . Number of children: Not on file  . Years of education: Not on file  . Highest education level: Not on file  Occupational History  . Not on file  Social Needs  . Financial resource strain: Not on file  . Food insecurity:    Worry: Not on file    Inability: Not on file  . Transportation needs:    Medical: Not on file    Non-medical: Not on file  Tobacco Use  . Smoking status: Current Some Day Smoker    Packs/day: 0.50    Types: Cigarettes  . Smokeless tobacco: Never Used  Substance and Sexual Activity  . Alcohol use: Yes  . Drug use: No  . Sexual activity: Yes    Birth control/protection: None  Lifestyle  . Physical activity:    Days per week: Not on file    Minutes per session: Not on file  . Stress: Not on file  Relationships  . Social connections:    Talks on phone: Not on file    Gets together: Not on file    Attends religious service: Not on file    Active member of club or organization: Not on file    Attends meetings of clubs or  organizations: Not on file    Relationship status: Not on file  Other Topics Concern  . Not on file  Social History Narrative  . Not on file    Family History  Problem Relation Age of Onset  . Crohn's disease Father   . Diabetes Maternal Uncle   . Diabetes Maternal Grandmother   . Diabetes Paternal Grandmother      ROS Review of Systems See HPI Constitution: see hpi No malaise No diaphoresis Skin: No rash or itching Eyes: no blurry vision, no double vision GU: no dysuria or hematuria Neuro: no dizziness or headaches all others reviewed and negative   Objective: Vitals:   04/09/18 1456  BP: 120/80  Pulse: 78  Resp: 16  Temp: 99 F (37.2 C)  TempSrc: Oral  SpO2: 96%  Weight: 242 lb 6.4 oz (110 kg)  Height: 5\' 6"  (1.676 m)    Physical Exam General: alert, oriented, in NAD Head: normocephalic, atraumatic, no sinus tenderness Eyes: EOM intact, no scleral icterus or conjunctival injection Ears: TM clear bilaterally Nose: mucosa nonerythematous, nonedematous Throat: + pharyngeal exudate or erythema Lymph: no posterior auricular, submental or cervical lymph adenopathy Heart: normal rate, normal  sinus rhythm, no murmurs Lungs: clear to auscultation bilaterally, no wheezing  Rapid strep negative   Assessment and Plan Tymesha was seen today for sore throat.  Diagnoses and all orders for this visit:  Streptococcal sore throat- will treat based on appearance and symptoms -     amoxicillin (AMOXIL) 500 MG capsule; Take 1 capsule (500 mg total) by mouth 2 (two) times daily for 10 days.  Sore throat -     POCT rapid strep A  Other orders -     fluconazole (DIFLUCAN) 150 MG tablet; Take 1 tablet (150 mg total) by mouth once for 1 dose.     Melanie Townsend

## 2018-04-10 DIAGNOSIS — N898 Other specified noninflammatory disorders of vagina: Secondary | ICD-10-CM | POA: Diagnosis not present

## 2018-04-10 DIAGNOSIS — Z113 Encounter for screening for infections with a predominantly sexual mode of transmission: Secondary | ICD-10-CM | POA: Diagnosis not present

## 2018-04-15 DIAGNOSIS — L732 Hidradenitis suppurativa: Secondary | ICD-10-CM

## 2018-04-15 HISTORY — DX: Hidradenitis suppurativa: L73.2

## 2018-04-23 ENCOUNTER — Other Ambulatory Visit: Payer: Self-pay

## 2018-04-23 ENCOUNTER — Encounter (HOSPITAL_BASED_OUTPATIENT_CLINIC_OR_DEPARTMENT_OTHER): Payer: Self-pay | Admitting: *Deleted

## 2018-04-30 ENCOUNTER — Ambulatory Visit (HOSPITAL_BASED_OUTPATIENT_CLINIC_OR_DEPARTMENT_OTHER): Payer: BLUE CROSS/BLUE SHIELD | Admitting: Anesthesiology

## 2018-04-30 ENCOUNTER — Ambulatory Visit (HOSPITAL_BASED_OUTPATIENT_CLINIC_OR_DEPARTMENT_OTHER)
Admission: RE | Admit: 2018-04-30 | Discharge: 2018-04-30 | Disposition: A | Discharge status: Home / Self Care | Payer: BLUE CROSS/BLUE SHIELD | Source: Ambulatory Visit | Attending: Specialist | Admitting: Specialist

## 2018-04-30 ENCOUNTER — Other Ambulatory Visit: Payer: Self-pay

## 2018-04-30 ENCOUNTER — Encounter (HOSPITAL_BASED_OUTPATIENT_CLINIC_OR_DEPARTMENT_OTHER): Payer: Self-pay | Admitting: *Deleted

## 2018-04-30 ENCOUNTER — Encounter (HOSPITAL_BASED_OUTPATIENT_CLINIC_OR_DEPARTMENT_OTHER)
Admission: RE | Disposition: A | Discharge status: Home / Self Care | Payer: Self-pay | Source: Ambulatory Visit | Attending: Specialist

## 2018-04-30 DIAGNOSIS — F329 Major depressive disorder, single episode, unspecified: Secondary | ICD-10-CM | POA: Insufficient documentation

## 2018-04-30 DIAGNOSIS — Z91013 Allergy to seafood: Secondary | ICD-10-CM | POA: Insufficient documentation

## 2018-04-30 DIAGNOSIS — Z888 Allergy status to other drugs, medicaments and biological substances status: Secondary | ICD-10-CM | POA: Insufficient documentation

## 2018-04-30 DIAGNOSIS — L732 Hidradenitis suppurativa: Secondary | ICD-10-CM | POA: Insufficient documentation

## 2018-04-30 DIAGNOSIS — F17210 Nicotine dependence, cigarettes, uncomplicated: Secondary | ICD-10-CM | POA: Insufficient documentation

## 2018-04-30 DIAGNOSIS — Z6841 Body Mass Index (BMI) 40.0 and over, adult: Secondary | ICD-10-CM | POA: Insufficient documentation

## 2018-04-30 HISTORY — DX: Other headache syndrome: G44.89

## 2018-04-30 HISTORY — DX: Premenstrual dysphoric disorder: F32.81

## 2018-04-30 HISTORY — PX: HYDRADENITIS EXCISION: SHX5243

## 2018-04-30 HISTORY — DX: Hidradenitis suppurativa: L73.2

## 2018-04-30 LAB — POCT PREGNANCY, URINE: PREG TEST UR: NEGATIVE

## 2018-04-30 SURGERY — EXCISION, HIDRADENITIS, AXILLA
Anesthesia: General | Site: Axilla | Laterality: Right

## 2018-04-30 MED ORDER — LACTATED RINGERS IV SOLN
INTRAVENOUS | Status: DC
  Administered 2018-04-30: 07:00:00 via INTRAVENOUS

## 2018-04-30 MED ORDER — SODIUM CHLORIDE 0.9 % IJ SOLN
INTRAMUSCULAR | Status: DC | PRN
  Administered 2018-04-30: 75 mL

## 2018-04-30 MED ORDER — CHLORHEXIDINE GLUCONATE CLOTH 2 % EX PADS: 6.0000 | MEDICATED_PAD | Freq: Once | CUTANEOUS | Status: DC

## 2018-04-30 MED ORDER — OXYCODONE HCL 5 MG PO TABS
ORAL_TABLET | ORAL | Status: AC
  Filled 2018-04-30: qty 1

## 2018-04-30 MED ORDER — FENTANYL CITRATE (PF) 100 MCG/2ML IJ SOLN
25.0000 ug | INTRAMUSCULAR | Status: DC | PRN
  Administered 2018-04-30 (×2): 50 ug via INTRAVENOUS

## 2018-04-30 MED ORDER — ONDANSETRON HCL 4 MG/2ML IJ SOLN
INTRAMUSCULAR | Status: AC
  Filled 2018-04-30: qty 2

## 2018-04-30 MED ORDER — LIDOCAINE-EPINEPHRINE 0.5 %-1:200000 IJ SOLN
INTRAMUSCULAR | Status: AC
  Filled 2018-04-30: qty 1

## 2018-04-30 MED ORDER — LIDOCAINE HCL (CARDIAC) PF 100 MG/5ML IV SOSY
PREFILLED_SYRINGE | INTRAVENOUS | Status: DC | PRN
  Administered 2018-04-30: 80 mg via INTRAVENOUS

## 2018-04-30 MED ORDER — MIDAZOLAM HCL 2 MG/2ML IJ SOLN
INTRAMUSCULAR | Status: AC
  Filled 2018-04-30: qty 2

## 2018-04-30 MED ORDER — MIDAZOLAM HCL 2 MG/2ML IJ SOLN: 1.0000 mg | INTRAMUSCULAR | Status: DC | PRN

## 2018-04-30 MED ORDER — CEFAZOLIN SODIUM-DEXTROSE 2-4 GM/100ML-% IV SOLN
2.0000 g | INTRAVENOUS | Status: AC
  Administered 2018-04-30: 2 g via INTRAVENOUS

## 2018-04-30 MED ORDER — CEFAZOLIN SODIUM-DEXTROSE 2-4 GM/100ML-% IV SOLN
INTRAVENOUS | Status: AC
  Filled 2018-04-30: qty 100

## 2018-04-30 MED ORDER — FENTANYL CITRATE (PF) 100 MCG/2ML IJ SOLN
INTRAMUSCULAR | Status: AC
  Filled 2018-04-30: qty 2

## 2018-04-30 MED ORDER — FENTANYL CITRATE (PF) 100 MCG/2ML IJ SOLN: 50.0000 ug | INTRAMUSCULAR | Status: DC | PRN

## 2018-04-30 MED ORDER — OXYCODONE HCL 5 MG/5ML PO SOLN: 5.0000 mg | Freq: Once | ORAL | Status: AC | PRN

## 2018-04-30 MED ORDER — MIDAZOLAM HCL 5 MG/5ML IJ SOLN
INTRAMUSCULAR | Status: DC | PRN
  Administered 2018-04-30: 2 mg via INTRAVENOUS

## 2018-04-30 MED ORDER — PROPOFOL 10 MG/ML IV BOLUS
INTRAVENOUS | Status: DC | PRN
  Administered 2018-04-30: 200 mg via INTRAVENOUS

## 2018-04-30 MED ORDER — DEXAMETHASONE SODIUM PHOSPHATE 4 MG/ML IJ SOLN
INTRAMUSCULAR | Status: DC | PRN
  Administered 2018-04-30: 10 mg via INTRAVENOUS

## 2018-04-30 MED ORDER — FENTANYL CITRATE (PF) 100 MCG/2ML IJ SOLN
INTRAMUSCULAR | Status: DC | PRN
  Administered 2018-04-30: 100 ug via INTRAVENOUS

## 2018-04-30 MED ORDER — SCOPOLAMINE 1 MG/3DAYS TD PT72: 1.0000 | MEDICATED_PATCH | Freq: Once | TRANSDERMAL | Status: DC | PRN

## 2018-04-30 MED ORDER — DEXAMETHASONE SODIUM PHOSPHATE 10 MG/ML IJ SOLN
INTRAMUSCULAR | Status: AC
  Filled 2018-04-30: qty 1

## 2018-04-30 MED ORDER — OXYCODONE HCL 5 MG PO TABS
5.0000 mg | ORAL_TABLET | Freq: Once | ORAL | Status: AC | PRN
  Administered 2018-04-30: 5 mg via ORAL

## 2018-04-30 MED ORDER — PROPOFOL 10 MG/ML IV BOLUS
INTRAVENOUS | Status: AC
  Filled 2018-04-30: qty 20

## 2018-04-30 MED ORDER — LIDOCAINE HCL (PF) 0.5 % IJ SOLN
INTRAMUSCULAR | Status: AC
  Filled 2018-04-30: qty 50

## 2018-04-30 MED ORDER — LIDOCAINE-EPINEPHRINE 0.5 %-1:200000 IJ SOLN
INTRAMUSCULAR | Status: DC | PRN
  Administered 2018-04-30: 75 mL

## 2018-04-30 MED ORDER — LIDOCAINE 2% (20 MG/ML) 5 ML SYRINGE
INTRAMUSCULAR | Status: AC
  Filled 2018-04-30: qty 5

## 2018-04-30 SURGICAL SUPPLY — 58 items
BAG DECANTER FOR FLEXI CONT (MISCELLANEOUS) ×2 IMPLANT
BENZOIN TINCTURE PRP APPL 2/3 (GAUZE/BANDAGES/DRESSINGS) IMPLANT
BLADE KNIFE PERSONA 10 (BLADE) ×2 IMPLANT
BLADE KNIFE PERSONA 15 (BLADE) IMPLANT
BNDG COHESIVE 4X5 TAN STRL (GAUZE/BANDAGES/DRESSINGS) ×2 IMPLANT
BRIEF STRETCH FOR OB PAD XXL (UNDERPADS AND DIAPERS) IMPLANT
CANISTER SUCT 1200ML W/VALVE (MISCELLANEOUS) ×2 IMPLANT
COVER BACK TABLE 60X90IN (DRAPES) ×2 IMPLANT
COVER MAYO STAND STRL (DRAPES) ×2 IMPLANT
DECANTER SPIKE VIAL GLASS SM (MISCELLANEOUS) IMPLANT
DRAIN CHANNEL 10M FLAT 3/4 FLT (DRAIN) IMPLANT
DRAIN CHANNEL 7F FF FLAT (WOUND CARE) IMPLANT
DRAPE U-SHAPE 76X120 STRL (DRAPES) ×2 IMPLANT
DRSG PAD ABDOMINAL 8X10 ST (GAUZE/BANDAGES/DRESSINGS) ×2 IMPLANT
DRSG TELFA 3X8 NADH (GAUZE/BANDAGES/DRESSINGS) IMPLANT
ELECT REM PT RETURN 9FT ADLT (ELECTROSURGICAL) ×2
ELECTRODE REM PT RTRN 9FT ADLT (ELECTROSURGICAL) ×1 IMPLANT
EVACUATOR SILICONE 100CC (DRAIN) IMPLANT
GAUZE SPONGE 4X4 12PLY STRL (GAUZE/BANDAGES/DRESSINGS) ×2 IMPLANT
GAUZE XEROFORM 5X9 LF (GAUZE/BANDAGES/DRESSINGS) IMPLANT
GLOVE BIO SURGEON STRL SZ 6.5 (GLOVE) ×2 IMPLANT
GLOVE BIOGEL M STRL SZ7.5 (GLOVE) ×4 IMPLANT
GLOVE BIOGEL PI IND STRL 8 (GLOVE) ×1 IMPLANT
GLOVE BIOGEL PI INDICATOR 8 (GLOVE) ×1
GLOVE ECLIPSE 7.0 STRL STRAW (GLOVE) ×2 IMPLANT
GOWN STRL REUS W/ TWL XL LVL3 (GOWN DISPOSABLE) ×2 IMPLANT
GOWN STRL REUS W/TWL XL LVL3 (GOWN DISPOSABLE) ×2
IV NS 500ML (IV SOLUTION) ×1
IV NS 500ML BAXH (IV SOLUTION) ×1 IMPLANT
MARKER SKIN DUAL TIP RULER LAB (MISCELLANEOUS) ×2 IMPLANT
NDL SAFETY ECLIPSE 18X1.5 (NEEDLE) IMPLANT
NEEDLE HYPO 18GX1.5 SHARP (NEEDLE)
NEEDLE HYPO 25X1 1.5 SAFETY (NEEDLE) IMPLANT
NEEDLE SPNL 18GX3.5 QUINCKE PK (NEEDLE) ×2 IMPLANT
NS IRRIG 1000ML POUR BTL (IV SOLUTION) ×2 IMPLANT
PACK BASIN DAY SURGERY FS (CUSTOM PROCEDURE TRAY) ×2 IMPLANT
PEN SKIN MARKING BROAD TIP (MISCELLANEOUS) ×2 IMPLANT
PIN SAFETY STERILE (MISCELLANEOUS) IMPLANT
SHEET MEDIUM DRAPE 40X70 STRL (DRAPES) ×2 IMPLANT
SLEEVE SCD COMPRESS KNEE MED (MISCELLANEOUS) ×2 IMPLANT
SPONGE LAP 18X18 RF (DISPOSABLE) ×4 IMPLANT
STAPLER VISISTAT 35W (STAPLE) IMPLANT
STOCKINETTE IMPERVIOUS LG (DRAPES) ×2 IMPLANT
STRIP SUTURE WOUND CLOSURE 1/2 (SUTURE) IMPLANT
SUT ETHILON 3 0 FSL (SUTURE) ×2 IMPLANT
SUT MNCRL AB 3-0 PS2 18 (SUTURE) ×2 IMPLANT
SUT MON AB 2-0 CT1 36 (SUTURE) ×2 IMPLANT
SUT PROLENE 4 0 P 3 18 (SUTURE) IMPLANT
SYR 20CC LL (SYRINGE) IMPLANT
SYR 50ML LL SCALE MARK (SYRINGE) ×2 IMPLANT
SYR CONTROL 10ML LL (SYRINGE) IMPLANT
SYR TB 1ML LL NO SAFETY (SYRINGE) IMPLANT
TOWEL GREEN STERILE FF (TOWEL DISPOSABLE) ×4 IMPLANT
TRAY DSU PREP LF (CUSTOM PROCEDURE TRAY) ×2 IMPLANT
TUBE CONNECTING 20X1/4 (TUBING) ×2 IMPLANT
UNDERPAD 30X30 (UNDERPADS AND DIAPERS) ×2 IMPLANT
VAC PENCILS W/TUBING CLEAR (MISCELLANEOUS) ×2 IMPLANT
YANKAUER SUCT BULB TIP NO VENT (SUCTIONS) ×2 IMPLANT

## 2018-04-30 NOTE — Discharge Instructions (Signed)
Activity (include date of return to work if known) As tolerated: NO showers NO driving No heavy activities  Diet:regular No restrictions:  Wound Care: Keep dressing clean & dry  Do not change dressings For Abdominoplasties wear abdominal binder Special Instructions: Do not raise arms up   Post Anesthesia Home Care Instructions  Activity: Get plenty of rest for the remainder of the day. A responsible individual must stay with you for 24 hours following the procedure.  For the next 24 hours, DO NOT: -Drive a car -Advertising copywriterperate machinery -Drink alcoholic beverages -Take any medication unless instructed by your physician -Make any legal decisions or sign important papers.  Meals: Start with liquid foods such as gelatin or soup. Progress to regular foods as tolerated. Avoid greasy, spicy, heavy foods. If nausea and/or vomiting occur, drink only clear liquids until the nausea and/or vomiting subsides. Call your physician if vomiting continues.  Special Instructions/Symptoms: Your throat may feel dry or sore from the anesthesia or the breathing tube placed in your throat during surgery. If this causes discomfort, gargle with warm salt water. The discomfort should disappear within 24 hours.  If you had a scopolamine patch placed behind your ear for the management of post- operative nausea and/or vomiting:  1. The medication in the patch is effective for 72 hours, after which it should be removed.  Wrap patch in a tissue and discard in the trash. Wash hands thoroughly with soap and water. 2. You may remove the patch earlier than 72 hours if you experience unpleasant side effects which may include dry mouth, dizziness or visual disturbances. 3. Avoid touching the patch. Wash your hands with soap and water after contact with the patch.      Continue to empty, recharge, & record drainage from J-P drains &/or Hemovacs 2-3 times a day, as needed. Call Doctor if any unusual problems occur such  as pain, excessive Bleeding, unrelieved Nausea/vomiting, Fever &/or chills When lying down, keep head elevated on 2-3 pillows or back-rest For Addominoplasties the Jack-knife position Follow-up appointment: Please call the office.  The patient received discharge instruction from:___________________________________________   Patient signature ________________________________________ / Date___________    Signature of individual providing instructions/ Date________________                 Post Anesthesia Home Care Instructions  Activity: Get plenty of rest for the remainder of the day. A responsible individual must stay with you for 24 hours following the procedure.  For the next 24 hours, DO NOT: -Drive a car -Advertising copywriterperate machinery -Drink alcoholic beverages -Take any medication unless instructed by your physician -Make any legal decisions or sign important papers.  Meals: Start with liquid foods such as gelatin or soup. Progress to regular foods as tolerated. Avoid greasy, spicy, heavy foods. If nausea and/or vomiting occur, drink only clear liquids until the nausea and/or vomiting subsides. Call your physician if vomiting continues.  Special Instructions/Symptoms: Your throat may feel dry or sore from the anesthesia or the breathing tube placed in your throat during surgery. If this causes discomfort, gargle with warm salt water. The discomfort should disappear within 24 hours.  If you had a scopolamine patch placed behind your ear for the management of post- operative nausea and/or vomiting:  1. The medication in the patch is effective for 72 hours, after which it should be removed.  Wrap patch in a tissue and discard in the trash. Wash hands thoroughly with soap and water. 2. You may remove the patch earlier  than 72 hours if you experience unpleasant side effects which may include dry mouth, dizziness or visual disturbances. 3. Avoid touching the patch. Wash your hands with soap  and water after contact with the patch.

## 2018-04-30 NOTE — Anesthesia Postprocedure Evaluation (Signed)
Anesthesia Post Note  Patient: Melanie Townsend  Procedure(s) Performed: EXCISION HIDRADENITIS RIGHT AXILLA (Right Axilla)     Patient location during evaluation: PACU Anesthesia Type: General Level of consciousness: awake and alert Pain management: pain level controlled Vital Signs Assessment: post-procedure vital signs reviewed and stable Respiratory status: spontaneous breathing, nonlabored ventilation, respiratory function stable and patient connected to nasal cannula oxygen Cardiovascular status: blood pressure returned to baseline and stable Postop Assessment: no apparent nausea or vomiting Anesthetic complications: no    Last Vitals:  Vitals:   04/30/18 0930 04/30/18 1000  BP: 115/74 108/72  Pulse: 76 85  Resp: 13 16  Temp:  36.8 C  SpO2: 100% 100%    Last Pain:  Vitals:   04/30/18 1000  TempSrc:   PainSc: 8                  Karalyne Nusser

## 2018-04-30 NOTE — Brief Op Note (Signed)
04/30/2018  8:52 AM  PATIENT:  Melanie Townsend  27 y.o. female  PRE-OPERATIVE DIAGNOSIS:  HIDRADENITIS  POST-OPERATIVE DIAGNOSIS:  HIDRADENITIS  PROCEDURE:  Procedure(s): EXCISION HIDRADENITIS RIGHT AXILLA (Right)  SURGEON:  Surgeon(s) and Role:    * Louisa Secondruesdale, Sabriyah Wilcher, MD - Primary  PHYSICIAN ASSISTANT:   ASSISTANTS: none   ANESTHESIA:   general  EBL:  12 mL   BLOOD ADMINISTERED:none  DRAINS: none   LOCAL MEDICATIONS USED:  LIDOCAINE   SPECIMEN:  Excision  DISPOSITION OF SPECIMEN:  PATHOLOGY  COUNTS:  YES  TOURNIQUET:  * No tourniquets in log *  DICTATION: .Other Dictation: Dictation Number [id number]  PLAN OF CARE: Discharge to home after PACU  PATIENT DISPOSITION:  PACU - hemodynamically stable.   Delay start of Pharmacological VTE agent (>24hrs) due to surgical blood loss or risk of bleeding: yes

## 2018-04-30 NOTE — Anesthesia Procedure Notes (Signed)
Procedure Name: LMA Insertion Date/Time: 04/30/2018 7:38 AM Performed by: [place] DesanctisLinka, Elin Seats L, CRNA Pre-anesthesia Checklist: Patient identified, Emergency Drugs available, Suction available, Patient being monitored and Timeout performed Patient Re-evaluated:Patient Re-evaluated prior to induction Oxygen Delivery Method: Circle system utilized Preoxygenation: Pre-oxygenation with 100% oxygen Induction Type: IV induction Ventilation: Mask ventilation without difficulty LMA: LMA flexible inserted LMA Size: 4.0 Number of attempts: 1 Airway Equipment and Method: Bite block Placement Confirmation: positive ETCO2 Tube secured with: Tape Dental Injury: Teeth and Oropharynx as per pre-operative assessment

## 2018-04-30 NOTE — H&P (Signed)
Melanie Townsend is an 27 y.o. female.   Chief Complaint: Increased hidraidnitis right axilla JXB:[id number]:Increased painful boils and infections requiring multi incision and drainages  Past Medical History:  Diagnosis Date  . Headache associated with hormonal factors   . Hidradenitis suppurativa of right axilla 04/2018  . PMDD (premenstrual dysphoric disorder)     Past Surgical History:  Procedure Laterality Date  . HYDRADENITIS EXCISION Left 02/26/2018   Procedure: EXCISION HIDRADENITIS TO THE LEFT AXILLA;  Surgeon: Louisa Secondruesdale, Dartanion Teo, MD;  Location: [facility];  Service: Plastics;  Laterality: Left;  . PILONIDAL CYST EXCISION      Family History  Problem Relation Age of Onset  . Crohn's disease Father   . Diabetes Maternal Uncle   . Diabetes Maternal Grandmother   . Diabetes Paternal Grandmother    Social History:  reports that she has been smoking cigarettes. She has a 1.65 pack-year smoking history. She has never used smokeless tobacco. She reports that she drinks alcohol. She reports that she does not use drugs.  Allergies:  Allergies  Allergen Reactions  . Iodine Hives       . Shellfish Allergy Hives    Medications Prior to Admission  Medication Sig Dispense Refill  . amoxicillin (AMOXIL) 500 MG capsule Take 500 mg by mouth 2 (two) times daily.    . Boric Acid (HM BORIC ACID) POWD Place rectally.    . Multiple Vitamin (MULTIVITAMIN) tablet Take 1 tablet by mouth daily.    . valACYclovir (VALTREX) 1000 MG tablet Take 1,000 mg by mouth 2 (two) times daily.      Results for orders placed or performed during the hospital encounter of 04/30/18 (from the past 48 hour(s))  Pregnancy, urine POC     Status: None   Collection Time: 04/30/18  7:08 AM  Result Value Ref Range   Preg Test, Ur NEGATIVE NEGATIVE    Comment:        THE SENSITIVITY OF THIS METHODOLOGY IS >24 mIU/mL    No results found.  Review of Systems  Constitutional: Negative.   HENT:  Negative.   Eyes: Negative.   Respiratory: Negative.   Cardiovascular: Negative.   Gastrointestinal: Negative.   Genitourinary: Negative.   Musculoskeletal: Negative.   Skin: Positive for rash.  Neurological: Negative.   Endo/Heme/Allergies: Negative.   Psychiatric/Behavioral: Negative.     Blood pressure 127/67, pulse 71, temperature 98.2 F (36.8 C), temperature source Oral, resp. rate 20, height 5\' 5"  (1.651 m), weight 110.2 kg, last menstrual period 04/02/2018, SpO2 100 %. Physical Exam  Constitutional: She is oriented to person, place, and time. She appears well-developed and well-nourished.  HENT:  Head: Normocephalic.  Eyes: Pupils are equal, round, and reactive to light. Conjunctivae are normal.  Neck: Normal range of motion.  Cardiovascular: Normal rate.  Murmur heard. Respiratory: Effort normal.  GI: Soft.  Musculoskeletal: Normal range of motion.  Neurological: She is alert and oriented to person, place, and time.  Skin: Skin is warm.  Psychiatric: She has a normal mood and affect.  Increased  Scars right axilla fron previous incision and drainages approx 10 for wide excision of all hair bearing area and Ryan Pollaclk flap closure   Assessment/Plan Severe hidraidnitis axilla for wide excision and Ryan Pollack flap closure  Naresh Althaus L, MD 04/30/2018, 7:24 AM

## 2018-04-30 NOTE — Transfer of Care (Signed)
Immediate Anesthesia Transfer of Care Note  Patient: Melanie Townsend  Procedure(s) Performed: EXCISION HIDRADENITIS RIGHT AXILLA (Right Axilla)  Patient Location: PACU  Anesthesia Type:General  Level of Consciousness: awake and patient cooperative  Airway & Oxygen Therapy: Patient Spontanous Breathing and Patient connected to face mask oxygen  Post-op Assessment: Report given to RN and Post -op Vital signs reviewed and stable  Post vital signs: Reviewed and stable  Last Vitals:  Vitals Value Taken Time  BP    Temp    Pulse 85 04/30/2018  8:49 AM  Resp    SpO2 100 % 04/30/2018  8:49 AM  Vitals shown include unvalidated device data.  Last Pain:  Vitals:   04/30/18 0657  TempSrc: Oral  PainSc: 0-No pain         Complications: No apparent anesthesia complications

## 2018-04-30 NOTE — Anesthesia Preprocedure Evaluation (Addendum)
Anesthesia Evaluation  Patient identified by MRN, date of birth, ID band Patient awake    Reviewed: Allergy & Precautions, NPO status , Patient's Chart, lab work & pertinent test results  History of Anesthesia Complications Negative for: history of anesthetic complications  Airway Mallampati: I  TM Distance: >3 FB Neck ROM: Full    Dental  (+) Teeth Intact   Pulmonary Current Smoker,    breath sounds clear to auscultation       Cardiovascular negative cardio ROS   Rhythm:Regular     Neuro/Psych  Headaches, PSYCHIATRIC DISORDERS Depression    GI/Hepatic negative GI ROS, Neg liver ROS,   Endo/Other  Morbid obesity  Renal/GU negative Renal ROS     Musculoskeletal negative musculoskeletal ROS (+)   Abdominal   Peds  Hematology negative hematology ROS (+)   Anesthesia Other Findings   Reproductive/Obstetrics                            Anesthesia Physical Anesthesia Plan  ASA: II  Anesthesia Plan: General   Post-op Pain Management:    Induction: Intravenous  PONV Risk Score and Plan: 2 and Ondansetron and Treatment may vary due to age or medical condition  Airway Management Planned: LMA and Oral ETT  Additional Equipment: None  Intra-op Plan:   Post-operative Plan: Extubation in OR  Informed Consent: I have reviewed the patients History and Physical, chart, labs and discussed the procedure including the risks, benefits and alternatives for the proposed anesthesia with the patient or authorized representative who has indicated his/her understanding and acceptance.   Dental advisory given  Plan Discussed with: Surgeon and CRNA  Anesthesia Plan Comments:         Anesthesia Quick Evaluation

## 2018-04-30 NOTE — Op Note (Signed)
Melanie Townsend: WHITTED-WOOTEN, Takeysha MEDICAL RECORD [id number] ACCOUNT [id number] DATE OF BIRTH:1991/02/06 FACILITY: [facility] LOCATION: [facility] PHYSICIAN:Porchea Charrier Preston FleetingL. Hiawatha Dressel, MD  OPERATIVE REPORT  DATE OF PROCEDURE:  04/30/2018  DIAGNOSIS:  Severe hidradenitis suppurativa involving armpit area.  SURGEON:  Louisa SecondGerald Maely Clements, MD  PROCEDURES:  Excision of all hidradenitis, right axilla including all hair-bearing areas totally, down through tissue superficial excision with flap closure using a Ryan-Pollack plastic closure.  INDICATIONS:  A 27 year old lady with a history of severe hidradenitis suppurativa involving the right and left arm pit area.  We had already done the left side, which was the worst side, removing infectious disease tissue from just about the whole  axilla.  The same on the right side, which we will prepare and do today.  Increased history of infections, drainage of areas approximately 10-12 times in the past in the emergency room and general surgery.  Procedure as planned, excision of all  hidradenitis, right axilla including all hair-bearing areas totally, down through tissue superficial excision with flap closure using a Ryan-Pollack plastic closure.  All procedures in detail were explained to the patient preoperatively.  The patient  understands and consents to surgery understanding possible risks and possible complications involving this type of surgery.  DESCRIPTION OF PROCEDURE:  Preoperatively, the patient underwent drawings for a wide elliptical excision of all hair-bearing area involving the right axilla.  She underwent general anesthesia and intubated orally.  Prep was done to the right chest, arms,  axillary and lower arm areas using Hibiclens soap and solution along with sterile towels and drapes, so as to make sterile field.  A 0.5% Xylocaine with epinephrine 1:100,000 concentration was then injected locally,  a total of 100 mL 1:200,000  concentration.  This was  allowed to sit up and then excision was done with the Bovie on cutting of all the previously drawn areas including all hair-bearing regions.  Dissection was carried down to underlying fascia.  After proper hemostasis, the whole  flap was removed down the entire length.  Hemostasis was maintained with Bovie electrocoagulation throughout the area.  After this, the laps were freed up significantly approximately 6 cm in size.  A rotational flap coverage was done and stay using deep  sutures of 2-0 Monocryl x3 levels and then a running subcuticular stitch of 3-0 Monocryl.  Steri-Strips and soft dressing were applied to all the areas.  The lower part of the incision was left open for drainage 4 x 4s, ABDs, Hypafix tape, Xeroform gauze  were placed and Hypafix tape.    The patient tolerated the procedure very well and was taken to recovery in good condition.  AN/NUANCE  D:04/30/2018 T:04/30/2018 JOB:[id number]

## 2018-05-01 ENCOUNTER — Encounter (HOSPITAL_BASED_OUTPATIENT_CLINIC_OR_DEPARTMENT_OTHER): Payer: Self-pay | Admitting: Specialist

## 2018-05-12 ENCOUNTER — Emergency Department (HOSPITAL_COMMUNITY)
Admission: EM | Admit: 2018-05-12 | Discharge: 2018-05-12 | Disposition: A | Discharge status: Home / Self Care | Payer: BLUE CROSS/BLUE SHIELD | Attending: Emergency Medicine | Admitting: Emergency Medicine

## 2018-05-12 ENCOUNTER — Other Ambulatory Visit: Payer: Self-pay

## 2018-05-12 ENCOUNTER — Encounter (HOSPITAL_COMMUNITY): Payer: Self-pay | Admitting: *Deleted

## 2018-05-12 DIAGNOSIS — T8140XA Infection following a procedure, unspecified, initial encounter: Secondary | ICD-10-CM | POA: Insufficient documentation

## 2018-05-12 DIAGNOSIS — T148XXA Other injury of unspecified body region, initial encounter: Secondary | ICD-10-CM

## 2018-05-12 DIAGNOSIS — Y658 Other specified misadventures during surgical and medical care: Secondary | ICD-10-CM | POA: Insufficient documentation

## 2018-05-12 DIAGNOSIS — L24A9 Irritant contact dermatitis due friction or contact with other specified body fluids: Secondary | ICD-10-CM

## 2018-05-12 DIAGNOSIS — F17210 Nicotine dependence, cigarettes, uncomplicated: Secondary | ICD-10-CM | POA: Insufficient documentation

## 2018-05-12 DIAGNOSIS — L089 Local infection of the skin and subcutaneous tissue, unspecified: Secondary | ICD-10-CM | POA: Diagnosis not present

## 2018-05-12 MED ORDER — CYCLOBENZAPRINE HCL 10 MG PO TABS: 10.0000 mg | ORAL_TABLET | Freq: Two times a day (BID) | ORAL | 0 refills | Status: DC | PRN

## 2018-05-12 NOTE — ED Notes (Signed)
Patient verbalizes understanding of discharge instructions. Opportunity for questioning and answers were provided. Armband removed by staff, pt discharged from ED.  

## 2018-05-12 NOTE — Discharge Instructions (Signed)
Keep your dressing applied until Wednesday.  Please call Dr. Charlesetta Garibaldi office if you develop any new or worsening symptoms.

## 2018-05-12 NOTE — ED Triage Notes (Signed)
Pt is s/p excision hidradenitis in R axilla on the 16th of this month.  Woke up this am with "clear watery" drainage from the incision. Reports taking the dsg off 2 days ago d/t foul odor.  Finished taking abx last week.  She denies fever or chills.  No purulent drainage noted.  Area is tender.  No edema or erythema noted.

## 2018-05-13 NOTE — ED Provider Notes (Signed)
[facility] EMERGENCY DEPARTMENT Provider Note   CSN: [id number] Arrival date & time: 05/12/18  1010     History   Chief Complaint Chief Complaint  Patient presents with  . incision draining    HPI Melanie Townsend is a 27 y.o. female with history of hidradenitis suppurativa who presents with a draining postop wound.  Patient reports having excision of her hidradenitis in her right axilla on 04/30/2018 by Dr. Shon Hough.  She has had good healing and no significant pain other than mild tenderness.  She finished her antibiotics last week.  She noticed some clear drainage with a foul odor which concerned her and brought her in today.  She has a follow-up appointment in 4 days.  She denies any fevers.  She reports having the left side completed in July 2019 and did not have the drainage then, but that is the only difference in the healing.  HPI  Past Medical History:  Diagnosis Date  . Headache associated with hormonal factors   . Hidradenitis suppurativa of right axilla 04/2018  . PMDD (premenstrual dysphoric disorder)     Patient Active Problem List   Diagnosis Date Noted  . Hidradenitis axillaris 09/25/2017    Past Surgical History:  Procedure Laterality Date  . HYDRADENITIS EXCISION Left 02/26/2018   Procedure: EXCISION HIDRADENITIS TO THE LEFT AXILLA;  Surgeon: Louisa Second, MD;  Location: [facility];  Service: Plastics;  Laterality: Left;  . HYDRADENITIS EXCISION Right 04/30/2018   Procedure: EXCISION HIDRADENITIS RIGHT AXILLA;  Surgeon: Louisa Second, MD;  Location: [facility];  Service: Plastics;  Laterality: Right;  . PILONIDAL CYST EXCISION       OB History   None      Home Medications    Prior to Admission medications   Medication Sig Start Date End Date Taking? Authorizing Provider  Boric Acid (HM BORIC ACID) POWD Place rectally.    [provider]  cyclobenzaprine (FLEXERIL) 10 MG tablet  Take 1 tablet (10 mg total) by mouth 2 (two) times daily as needed for muscle spasms. 05/12/18   Marni Franzoni, Waylan Boga, PA-C  valACYclovir (VALTREX) 1000 MG tablet Take 1,000 mg by mouth 2 (two) times daily.    [provider]    Family History Family History  Problem Relation Age of Onset  . Crohn's disease Father   . Diabetes Maternal Uncle   . Diabetes Maternal Grandmother   . Diabetes Paternal Grandmother     Social History Social History   Tobacco Use  . Smoking status: Current Every Day Smoker    Packs/day: 0.33    Years: 5.00    Pack years: 1.65    Types: Cigarettes  . Smokeless tobacco: Never Used  Substance Use Topics  . Alcohol use: Yes  . Drug use: No     Allergies   Iodine and Shellfish allergy   Review of Systems Review of Systems  Constitutional: Negative for fever.  Skin: Positive for wound. Negative for color change.     Physical Exam Updated Vital Signs BP 118/90 (BP Location: Left Arm)   Pulse 75   Temp 99.2 F (37.3 C) (Oral)   Resp 18   LMP 04/30/2018   SpO2 100%   Physical Exam  Constitutional: She appears well-developed and well-nourished. No distress.  HENT:  Head: Normocephalic and atraumatic.  Mouth/Throat: Oropharynx is clear and moist. No oropharyngeal exudate.  Eyes: Pupils are equal, round, and reactive to light. Conjunctivae are normal. Right  eye exhibits no discharge. Left eye exhibits no discharge. No scleral icterus.  Neck: Normal range of motion. Neck supple. No thyromegaly present.  Cardiovascular: Normal rate, regular rhythm, normal heart sounds and intact distal pulses. Exam reveals no gallop and no friction rub.  No murmur heard. Pulmonary/Chest: Effort normal and breath sounds normal. No stridor. No respiratory distress. She has no wheezes. She has no rales.  Musculoskeletal: She exhibits no edema.  Lymphadenopathy:    She has no cervical adenopathy.  Neurological: She is alert. Coordination normal.  Skin: Skin  is warm and dry. No rash noted. She is not diaphoretic. No pallor.  Patient with large, sutured wound to the right axilla that is very well-appearing with no erythema or active drainage, mild tenderness on palpation  Psychiatric: She has a normal mood and affect.  Nursing note and vitals reviewed.    ED Treatments / Results  Labs (all labs ordered are listed, but only abnormal results are displayed) Labs Reviewed - No data to display  EKG None  Radiology No results found.  Procedures Procedures (including critical care time)  Medications Ordered in ED Medications - No data to display   Initial Impression / Assessment and Plan / ED Course  I have reviewed the triage vital signs and the nursing notes.  Pertinent labs & imaging results that were available during my care of the patient were reviewed by me and considered in my medical decision making (see chart for details).     Patient with well-appearing postsurgical wound.  Discussed patient's case with her plastic surgeon, Dr. Shon Hough, who advised that this is normal healing.  Also advised that patient can call him at any time if she has any questions and that she does not need to go to the emergency department without talking to him.  She has a follow-up appointment in 3 days, which she reportedly missed her first 1, per her surgeon.  No indication to begin more antibiotics.  Dr. Shon Hough advised wound dressing and to keep applied until her appointment.  Patient requested Flexeril to help her sleep as she has been having to sleep in a strange position with her wound.  Will discharge home with Flexeril, she has taken well before.  Patient understands and agrees with plan.  Return precautions discussed.  Patient vitals stable throughout ED course and discharged in satisfactory condition.  Final Clinical Impressions(s) / ED Diagnoses   Final diagnoses:  Drainage from wound    ED Discharge Orders         Ordered     cyclobenzaprine (FLEXERIL) 10 MG tablet  2 times daily PRN,   Status:  Discontinued     05/12/18 1324    cyclobenzaprine (FLEXERIL) 10 MG tablet  2 times daily PRN     05/12/18 1325           Ayo Smoak, [place], PA-C 05/13/18 1610    Arby Barrette, MD 05/23/18 0745

## 2018-07-14 ENCOUNTER — Encounter (HOSPITAL_COMMUNITY): Payer: Self-pay | Admitting: *Deleted

## 2018-07-14 ENCOUNTER — Ambulatory Visit (HOSPITAL_COMMUNITY)
Admission: EM | Admit: 2018-07-14 | Discharge: 2018-07-14 | Disposition: A | Discharge status: Home / Self Care | Payer: BLUE CROSS/BLUE SHIELD | Attending: Emergency Medicine | Admitting: Emergency Medicine

## 2018-07-14 DIAGNOSIS — K6289 Other specified diseases of anus and rectum: Secondary | ICD-10-CM

## 2018-07-14 LAB — OCCULT BLOOD, POC DEVICE: FECAL OCCULT BLD: NEGATIVE

## 2018-07-14 MED ORDER — HYDROCORTISONE ACETATE 25 MG RE SUPP: 25.0000 mg | Freq: Two times a day (BID) | RECTAL | 0 refills | Status: DC

## 2018-07-14 NOTE — ED Triage Notes (Signed)
Reports noticing "boil or something" at rectum approx 3 days ago.  Denies hx hemorrhoids.  Denies rectal bleeding.

## 2018-07-14 NOTE — Discharge Instructions (Signed)
Please try Anusol HC suppositories twice daily to help with discomfort and to treat any internal hemorrhoids/fissure Increase fiber in diet and fluid intake If having worsening pain in rectum or symptoms not improving please go to emergency room or they may obtain scans to ensure there is no perirectal abscess

## 2018-07-15 NOTE — ED Provider Notes (Signed)
[facility]    CSN: [id number] Arrival date & time: 07/14/18  1425     History   Chief Complaint Chief Complaint  Patient presents with  . Appointment    2:30pm  . Hemorrhoids    HPI Melanie Townsend is a 27 y.o. female history of hidradenitis suppurativa presenting today for evaluation of rectal pain.  Patient states that over the past 3 days she has developed discomfort to hurt in her rectum.  She states that she has a history of a pilonidal cyst, had surgery to remove this and has not had any issues with abscesses to her buttock area.  She has had occasional abscesses to her axilla.  She denies any drainage or tenderness.  Does note that she sits a lot at work.  She denies any bleeding.  Denies history of hemorrhoids, but does note that she has a skin tag.  She has not noticed any increased enlargements in this area.  She denies straining with bowel movements.  Denies constipation.  Has daily morning bowel movements.  HPI  Past Medical History:  Diagnosis Date  . Headache associated with hormonal factors   . Hidradenitis suppurativa of right axilla 04/2018  . PMDD (premenstrual dysphoric disorder)     Patient Active Problem List   Diagnosis Date Noted  . Class 2 obesity due to excess calories without serious comorbidity with body mass index (BMI) of 38.0 to 38.9 in adult 11/10/2017  . Hidradenitis axillaris 09/25/2017    Past Surgical History:  Procedure Laterality Date  . HYDRADENITIS EXCISION Left 02/26/2018   Procedure: EXCISION HIDRADENITIS TO THE LEFT AXILLA;  Surgeon: Louisa Second, MD;  Location: [facility];  Service: Plastics;  Laterality: Left;  . HYDRADENITIS EXCISION Right 04/30/2018   Procedure: EXCISION HIDRADENITIS RIGHT AXILLA;  Surgeon: Louisa Second, MD;  Location: [facility];  Service: Plastics;  Laterality: Right;  . PILONIDAL CYST EXCISION      OB History   None      Home Medications     Prior to Admission medications   Medication Sig Start Date End Date Taking? Authorizing Provider  Boric Acid (HM BORIC ACID) POWD Place rectally.   Yes [provider]  hydrocortisone (ANUSOL-HC) 25 MG suppository Place 1 suppository (25 mg total) rectally 2 (two) times daily. 07/14/18   Kamaryn Grimley, Junius Creamer, PA-C    Family History Family History  Problem Relation Age of Onset  . Crohn's disease Father   . Diabetes Maternal Uncle   . Diabetes Maternal Grandmother   . Diabetes Paternal Grandmother     Social History Social History   Tobacco Use  . Smoking status: Current Some Day Smoker    Packs/day: 0.33    Years: 5.00    Pack years: 1.65    Types: Cigarettes  . Smokeless tobacco: Never Used  Substance Use Topics  . Alcohol use: Yes    Comment: occasionally  . Drug use: No     Allergies   Iodine and Shellfish allergy   Review of Systems Review of Systems  Constitutional: Negative for fever.  Respiratory: Negative for shortness of breath.   Cardiovascular: Negative for chest pain.  Gastrointestinal: Positive for rectal pain. Negative for abdominal pain, blood in stool, diarrhea, nausea and vomiting.  Genitourinary: Negative for dysuria, flank pain, genital sores, hematuria, menstrual problem, vaginal bleeding, vaginal discharge and vaginal pain.  Musculoskeletal: Negative for back pain.  Skin: Negative for rash.  Neurological: Negative for dizziness, light-headedness and  headaches.     Physical Exam Triage Vital Signs ED Triage Vitals  Enc Vitals Group     BP 07/14/18 1443 127/89     Pulse Rate 07/14/18 1443 83     Resp 07/14/18 1443 16     Temp 07/14/18 1443 98.3 F (36.8 C)     Temp src --      SpO2 07/14/18 1443 99 %     Weight --      Height --      Head Circumference --      Peak Flow --      Pain Score 07/14/18 1444 8     Pain Loc --      Pain Edu? --      Excl. in GC? --    No data found.  Updated Vital Signs BP 127/89   Pulse  83   Temp 98.3 F (36.8 C)   Resp 16   LMP 06/25/2018 (Exact Date)   SpO2 99%   Visual Acuity Right Eye Distance:   Left Eye Distance:   Bilateral Distance:    Right Eye Near:   Left Eye Near:    Bilateral Near:     Physical Exam  Constitutional: She is oriented to person, place, and time. She appears well-developed and well-nourished.  No acute distress  HENT:  Head: Normocephalic and atraumatic.  Nose: Nose normal.  Eyes: Conjunctivae are normal.  Neck: Neck supple.  Cardiovascular: Normal rate.  Pulmonary/Chest: Effort normal. No respiratory distress.  Abdominal: Soft. She exhibits no distension. There is no tenderness.  Nontender to light deep palpation throughout abdomen  Genitourinary:  Genitourinary Comments: No induration erythema or tenderness to palpation throughout pilonidal area bilateral gluteal clefts extending to rectum, small skin tag at 6 o'clock position, no tenderness to palpation around rectum, no external hemorrhoids, on DRE slight increase in tenderness palpated to 12 o'clock position and mild discomfort with entire exam, no significant amount of stool in rectal vault  Musculoskeletal: Normal range of motion.  Neurological: She is alert and oriented to person, place, and time.  Skin: Skin is warm and dry.  Psychiatric: She has a normal mood and affect.  Nursing note and vitals reviewed.    UC Treatments / Results  Labs (all labs ordered are listed, but only abnormal results are displayed) Labs Reviewed  OCCULT BLOOD, POC DEVICE   Hemoccult negative EKG None  Radiology No results found.  Procedures Procedures (including critical care time)  Medications Ordered in UC Medications - No data to display  Initial Impression / Assessment and Plan / UC Course  I have reviewed the triage vital signs and the nursing notes.  Pertinent labs & imaging results that were available during my care of the patient were reviewed by me and considered in my  medical decision making (see chart for details).     No palpable masses or deformities on rectal exam, no signs of hemorrhoids, Hemoccult negative is concerning for anal fissure.  Will treat for hemorrhoids internally with Anusol HC suppositories.  Given this patient's history of hidradenitis super Activa, recommending to go to emergency room if having persistent or worsening symptoms to rule out perirectal abscess.  No induration or fluctuance palpated during exam, no drainage observed no significantly tender area compared to rest of exam.Discussed strict return precautions. Patient verbalized understanding and is agreeable with plan.  Final Clinical Impressions(s) / UC Diagnoses   Final diagnoses:  Rectal pain     Discharge Instructions  Please try Anusol HC suppositories twice daily to help with discomfort and to treat any internal hemorrhoids/fissure Increase fiber in diet and fluid intake If having worsening pain in rectum or symptoms not improving please go to emergency room or they may obtain scans to ensure there is no perirectal abscess   ED Prescriptions    Medication Sig Dispense Auth. Provider   hydrocortisone (ANUSOL-HC) 25 MG suppository Place 1 suppository (25 mg total) rectally 2 (two) times daily. 12 suppository Xaden Kaufman, [place] C, PA-C     Controlled Substance Prescriptions [place] Controlled Substance Registry consulted? Not Applicable   Lew DawesWieters, Raquel Sayres C, [place]-C 07/15/18 [id number]

## 2018-07-16 ENCOUNTER — Encounter (HOSPITAL_COMMUNITY): Payer: Self-pay

## 2018-07-16 ENCOUNTER — Emergency Department (HOSPITAL_COMMUNITY): Payer: BLUE CROSS/BLUE SHIELD

## 2018-07-16 ENCOUNTER — Inpatient Hospital Stay (HOSPITAL_COMMUNITY)
Admission: EM | Admit: 2018-07-16 | Discharge: 2018-07-16 | DRG: 395 | Discharge status: Against Medical Advice | Payer: BLUE CROSS/BLUE SHIELD | Attending: General Surgery | Admitting: General Surgery

## 2018-07-16 DIAGNOSIS — Z6838 Body mass index (BMI) 38.0-38.9, adult: Secondary | ICD-10-CM

## 2018-07-16 DIAGNOSIS — E669 Obesity, unspecified: Secondary | ICD-10-CM | POA: Diagnosis present

## 2018-07-16 DIAGNOSIS — K611 Rectal abscess: Principal | ICD-10-CM | POA: Diagnosis present

## 2018-07-16 DIAGNOSIS — R000 Tachycardia, unspecified: Secondary | ICD-10-CM | POA: Diagnosis not present

## 2018-07-16 DIAGNOSIS — F17210 Nicotine dependence, cigarettes, uncomplicated: Secondary | ICD-10-CM | POA: Diagnosis not present

## 2018-07-16 DIAGNOSIS — T490X5A Adverse effect of local antifungal, anti-infective and anti-inflammatory drugs, initial encounter: Secondary | ICD-10-CM | POA: Diagnosis not present

## 2018-07-16 DIAGNOSIS — K6289 Other specified diseases of anus and rectum: Secondary | ICD-10-CM | POA: Diagnosis not present

## 2018-07-16 LAB — CBC WITH DIFFERENTIAL/PLATELET
Abs Immature Granulocytes: 0.06 10*3/uL (ref 0.00–0.07)
Basophils Absolute: 0 10*3/uL (ref 0.0–0.1)
Basophils Relative: 0 %
Eosinophils Absolute: 0.1 10*3/uL (ref 0.0–0.5)
Eosinophils Relative: 0 %
HCT: 40.1 % (ref 36.0–46.0)
Hemoglobin: 12.5 g/dL (ref 12.0–15.0)
Immature Granulocytes: 0 %
Lymphocytes Relative: 19 %
Lymphs Abs: 2.6 10*3/uL (ref 0.7–4.0)
MCH: 27.5 pg (ref 26.0–34.0)
MCHC: 31.2 g/dL (ref 30.0–36.0)
MCV: 88.1 fL (ref 80.0–100.0)
Monocytes Absolute: 1 10*3/uL (ref 0.1–1.0)
Monocytes Relative: 7 %
Neutro Abs: 9.9 10*3/uL — ABNORMAL HIGH (ref 1.7–7.7)
Neutrophils Relative %: 74 %
Platelets: 353 10*3/uL (ref 150–400)
RBC: 4.55 MIL/uL (ref 3.87–5.11)
RDW: 13.7 % (ref 11.5–15.5)
WBC: 13.6 10*3/uL — ABNORMAL HIGH (ref 4.0–10.5)
nRBC: 0 % (ref 0.0–0.2)

## 2018-07-16 LAB — BASIC METABOLIC PANEL
Anion gap: 7 (ref 5–15)
BUN: 8 mg/dL (ref 6–20)
CO2: 27 mmol/L (ref 22–32)
Calcium: 9.5 mg/dL (ref 8.9–10.3)
Chloride: 102 mmol/L (ref 98–111)
Creatinine, Ser: 0.7 mg/dL (ref 0.44–1.00)
GFR calc Af Amer: >60 mL/min (ref >60)
GFR calc non Af Amer: >60 mL/min (ref >60)
Glucose, Bld: 78 mg/dL (ref 70–99)
Potassium: 3.9 mmol/L (ref 3.5–5.1)
Sodium: 136 mmol/L (ref 135–145)

## 2018-07-16 LAB — POC URINE PREG, ED: Preg Test, Ur: NEGATIVE

## 2018-07-16 MED ORDER — DIPHENHYDRAMINE HCL 50 MG/ML IJ SOLN: 25.0000 mg | Freq: Four times a day (QID) | INTRAMUSCULAR | Status: DC | PRN

## 2018-07-16 MED ORDER — HYDRALAZINE HCL 20 MG/ML IJ SOLN: 10.0000 mg | INTRAMUSCULAR | Status: DC | PRN

## 2018-07-16 MED ORDER — ONDANSETRON 4 MG PO TBDP: 4.0000 mg | ORAL_TABLET | Freq: Four times a day (QID) | ORAL | Status: DC | PRN

## 2018-07-16 MED ORDER — PIPERACILLIN-TAZOBACTAM 3.375 G IVPB: 3.3750 g | Freq: Three times a day (TID) | INTRAVENOUS | Status: DC

## 2018-07-16 MED ORDER — MORPHINE SULFATE (PF) 4 MG/ML IV SOLN: 4.0000 mg | Freq: Once | INTRAVENOUS | Status: DC

## 2018-07-16 MED ORDER — FAMOTIDINE 20 MG PO TABS: 20.0000 mg | ORAL_TABLET | Freq: Every day | ORAL | Status: DC

## 2018-07-16 MED ORDER — ACETAMINOPHEN 500 MG PO TABS: 1000.0000 mg | ORAL_TABLET | Freq: Four times a day (QID) | ORAL | Status: DC

## 2018-07-16 MED ORDER — ALPRAZOLAM 0.25 MG PO TABS
0.2500 mg | ORAL_TABLET | Freq: Once | ORAL | Status: AC
  Administered 2018-07-16: 0.25 mg via ORAL
  Filled 2018-07-16: qty 1

## 2018-07-16 MED ORDER — PIPERACILLIN-TAZOBACTAM 3.375 G IVPB 30 MIN: 3.3750 g | Freq: Once | INTRAVENOUS | Status: DC

## 2018-07-16 MED ORDER — MORPHINE SULFATE (PF) 2 MG/ML IV SOLN: 2.0000 mg | INTRAVENOUS | Status: DC | PRN

## 2018-07-16 MED ORDER — SODIUM CHLORIDE 0.9 % IV SOLN: INTRAVENOUS | Status: DC

## 2018-07-16 MED ORDER — IOHEXOL 300 MG/ML  SOLN
100.0000 mL | Freq: Once | INTRAMUSCULAR | Status: AC
  Administered 2018-07-16: 75 mL via INTRAVENOUS

## 2018-07-16 MED ORDER — OXYCODONE HCL 5 MG PO TABS: 5.0000 mg | ORAL_TABLET | ORAL | Status: DC | PRN

## 2018-07-16 MED ORDER — ONDANSETRON HCL 4 MG/2ML IJ SOLN: 4.0000 mg | Freq: Four times a day (QID) | INTRAMUSCULAR | Status: DC | PRN

## 2018-07-16 MED ORDER — DIPHENHYDRAMINE HCL 25 MG PO CAPS: 25.0000 mg | ORAL_CAPSULE | Freq: Four times a day (QID) | ORAL | Status: DC | PRN

## 2018-07-16 MED ORDER — DOCUSATE SODIUM 100 MG PO CAPS: 100.0000 mg | ORAL_CAPSULE | Freq: Two times a day (BID) | ORAL | Status: DC

## 2018-07-16 NOTE — ED Notes (Signed)
Pt requesting to leave AMA. Pt states she is unsatisfied with her care. RN asked if there was anything we could do for her. RN notified MD Janee Mornhompson.

## 2018-07-16 NOTE — ED Triage Notes (Signed)
Pt presents for evaluation of ongoing rectal pain, has been evaluated multiple times for same. Hx of cyst and hemorrhoids, has tried suppositories with no relief.

## 2018-07-16 NOTE — ED Provider Notes (Cosign Needed)
[facility] Provider Note   CSN: [id number] Arrival date & time: 07/16/18  1032     History   Chief Complaint Chief Complaint  Patient presents with  . Rectal Pain    HPI Melanie Townsend is a 27 y.o. female.  HPI   27 year old female presents today with complaints of rectal pain.  Patient notes over the last week she has had worsening pain in her rectum and gluteus.  She denies any nodules or swelling in the area, denies any fever.  She notes a history of pilonidal abscess, hidradenitis.  Patient denies any blood her pus in her bowel movements.  She has been using suppository hemorrhoid medicine without improvement in symptoms.   Past Medical History:  Diagnosis Date  . Headache associated with hormonal factors   . Hidradenitis suppurativa of right axilla 04/2018  . PMDD (premenstrual dysphoric disorder)     Patient Active Problem List   Diagnosis Date Noted  . Class 2 obesity due to excess calories without serious comorbidity with body mass index (BMI) of 38.0 to 38.9 in adult 11/10/2017  . Hidradenitis axillaris 09/25/2017    Past Surgical History:  Procedure Laterality Date  . HYDRADENITIS EXCISION Left 02/26/2018   Procedure: EXCISION HIDRADENITIS TO THE LEFT AXILLA;  Surgeon: Louisa Secondruesdale, Gerald, MD;  Location: [facility];  Service: Plastics;  Laterality: Left;  . HYDRADENITIS EXCISION Right 04/30/2018   Procedure: EXCISION HIDRADENITIS RIGHT AXILLA;  Surgeon: Louisa Secondruesdale, Gerald, MD;  Location: [facility];  Service: Plastics;  Laterality: Right;  . PILONIDAL CYST EXCISION       OB History   None      Home Medications    Prior to Admission medications   Medication Sig Start Date End Date Taking? Authorizing Provider  Boric Acid (HM BORIC ACID) POWD Place rectally.    [provider]  hydrocortisone (ANUSOL-HC) 25 MG suppository Place 1 suppository (25 mg total) rectally 2 (two)  times daily. 07/14/18   Wieters, Junius CreamerHallie C, PA-C    Family History Family History  Problem Relation Age of Onset  . Crohn's disease Father   . Diabetes Maternal Uncle   . Diabetes Maternal Grandmother   . Diabetes Paternal Grandmother     Social History Social History   Tobacco Use  . Smoking status: Current Some Day Smoker    Packs/day: 0.33    Years: 5.00    Pack years: 1.65    Types: Cigarettes  . Smokeless tobacco: Never Used  Substance Use Topics  . Alcohol use: Yes    Comment: occasionally  . Drug use: No     Allergies   Iodine and Shellfish allergy   Review of Systems Review of Systems  All other systems reviewed and are negative.    Physical Exam Updated Vital Signs BP (!) 122/100 (BP Location: Right Arm)   Pulse (!) 105   Temp 99 F (37.2 C) (Oral)   Resp 16   LMP 06/25/2018 (Exact Date)   SpO2 100%   Physical Exam  Constitutional: She is oriented to person, place, and time. She appears well-developed and well-nourished.  HENT:  Head: Normocephalic and atraumatic.  Eyes: Pupils are equal, round, and reactive to light. Conjunctivae are normal. Right eye exhibits no discharge. Left eye exhibits no discharge. No scleral icterus.  Neck: Normal range of motion. No JVD present. No tracheal deviation present.  Pulmonary/Chest: Effort normal. No stridor.  Genitourinary:  Genitourinary Comments: No obvious fluctuance or  redness noted on external exam of the rectum or gluteus, tenderness palpation of the right medial gluteus extending down to the rectum-dental exam shows no masses but tenderness along the left lateral rectal wall  Neurological: She is alert and oriented to person, place, and time. Coordination normal.  Psychiatric: She has a normal mood and affect. Her behavior is normal. Judgment and thought content normal.  Nursing note and vitals reviewed.   ED Treatments / Results  Labs (all labs ordered are listed, but only abnormal results are  displayed) Labs Reviewed  CBC WITH DIFFERENTIAL/PLATELET - Abnormal; Notable for the following components:      Result Value   WBC 13.6 (*)    Neutro Abs 9.9 (*)    All other components within normal limits  BASIC METABOLIC PANEL  POC URINE PREG, ED    EKG None  Radiology Ct Pelvis W Contrast  Result Date: 07/16/2018 CLINICAL DATA:  Hx of Hidradenitis Suppurativa; pt has had rectal pain x 4 days. EXAM: CT PELVIS WITH CONTRAST TECHNIQUE: Multidetector CT imaging of the pelvis was performed using the standard protocol following the bolus administration of intravenous contrast. CONTRAST:  <See Chart> OMNIPAQUE IOHEXOL 300 MG/ML  SOLN COMPARISON:  None. FINDINGS: Perineum/soft tissues: There is inflammation along the posterior margin the bones, surrounding a small irregular fluid collection, which extends from the subcutaneous tissues the right inferior/medial buttock just below and posterior to the anus, superiorly to the region of the junction of the rectum and anus, below the levator sling. The fluid collection measures approximately 3.1 x 1.7 x 3.3 cm. There is no inflammation fluid collection above the levator sling. Urinary Tract:  No abnormality visualized. Bowel: No abnormality of the rectum or visualized small bowel colon. Normal appendix visualized. Vascular/Lymphatic: No enlarged lymph nodes. No vascular abnormality. Reproductive:  Uterus and adnexa unremarkable. Other:  None Musculoskeletal: No skeletal abnormality. IMPRESSION: 1. Small posterior perianal collection consistent with a perianal abscess with adjacent inflammation. The abscess and inflammation lies inferior to the levator sling. 2. No other abnormality. Electronically Signed   By: Amie Portland M.D.   On: 07/16/2018 15:03    Procedures Procedures (including critical care time)  Medications Ordered in ED Medications  morphine 4 MG/ML injection 4 mg (has no administration in time range)  iohexol (OMNIPAQUE) 300 MG/ML  solution 100 mL (75 mLs Intravenous Contrast Given 07/16/18 1456)     Initial Impression / Assessment and Plan / ED Course  I have reviewed the triage vital signs and the nursing notes.  Pertinent labs & imaging results that were available during my care of the patient were reviewed by me and considered in my medical decision making (see chart for details).     Patient's presentation was consistent with a rectal abscess.  She has elevated white count.  Patient put her close on and went into the waiting room on her own and was eating chips.  Patient will be placed into a room, given pain medicine general surgery consulted for further evaluation and management.    Final Clinical Impressions(s) / ED Diagnoses   Final diagnoses:  Perirectal abscess    ED Discharge Orders    None       Eyvonne Mechanic, PA-C 07/16/18 1537

## 2018-07-16 NOTE — ED Notes (Signed)
PAGED [facility] PER RN

## 2018-07-16 NOTE — H&P (Signed)
[facility] Consult/Admission Note  Melanie Townsend [birth year]  [id number].    Requesting MD: Jeralyn Bennett, PA-C Chief Complaint/Reason for Consult: perirectal abscess  HPI:  Pt is a 27 yo female with a history of hydradenitis, pilonidal cyst, and obesity who presented to the ED with complaints of rectal pain that started a week ago. Pain progressively worsened over the last week and worse with BM's and sitting. No fever, chills, nausea or vomiting. Pt has not had this before. No anticoagulation therapy.   ROS:  Review of Systems  Constitutional: Negative for chills, diaphoresis and fever.  HENT: Negative for sore throat.   Respiratory: Negative for cough and shortness of breath.   Cardiovascular: Negative for chest pain.  Gastrointestinal: Negative for abdominal pain, blood in stool, constipation, diarrhea, nausea and vomiting.  Genitourinary: Negative for dysuria.       + Rectal pain  Skin: Negative for rash.  Neurological: Negative for dizziness and loss of consciousness.  All other systems reviewed and are negative.    Family History  Problem Relation Age of Onset  . Crohn's disease Father   . Diabetes Maternal Uncle   . Diabetes Maternal Grandmother   . Diabetes Paternal Grandmother     Past Medical History:  Diagnosis Date  . Headache associated with hormonal factors   . Hidradenitis suppurativa of right axilla 04/2018  . PMDD (premenstrual dysphoric disorder)     Past Surgical History:  Procedure Laterality Date  . HYDRADENITIS EXCISION Left 02/26/2018   Procedure: EXCISION HIDRADENITIS TO THE LEFT AXILLA;  Surgeon: Louisa Second, MD;  Location: [facility];  Service: Plastics;  Laterality: Left;  . HYDRADENITIS EXCISION Right 04/30/2018   Procedure: EXCISION HIDRADENITIS RIGHT AXILLA;  Surgeon: Louisa Second, MD;  Location: [facility];  Service: Plastics;  Laterality: Right;  . PILONIDAL CYST EXCISION       Social History:  reports that she has been smoking cigarettes. She has a 1.65 pack-year smoking history. She has never used smokeless tobacco. She reports that she drinks alcohol. She reports that she does not use drugs.  Allergies:  Allergies  Allergen Reactions  . Iodine Hives       . Shellfish Allergy Hives     (Not in a hospital admission)  Blood pressure (!) 122/100, pulse (!) 105, temperature 99 F (37.2 C), temperature source Oral, resp. rate 16, last menstrual period 06/25/2018, SpO2 100 %.  Physical Exam  Constitutional: She is oriented to person, place, and time. She appears well-developed and well-nourished. No distress.  HENT:  Head: Normocephalic and atraumatic.  Nose: Nose normal.  Mouth/Throat: Oropharynx is clear and moist and mucous membranes are normal. No oropharyngeal exudate.  Eyes: Pupils are equal, round, and reactive to light. Conjunctivae are normal. Right eye exhibits no discharge. Left eye exhibits no discharge. No scleral icterus.  Neck: Normal range of motion. Neck supple. No thyromegaly present.  Cardiovascular: Normal rate, regular rhythm, normal heart sounds and intact distal pulses.  No murmur heard. Pulses:      Radial pulses are 2+ on the right side, and 2+ on the left side.  Pulmonary/Chest: Effort normal and breath sounds normal. No respiratory distress. She has no wheezes. She has no rhonchi. She has no rales.  Abdominal: Soft. Bowel sounds are normal. She exhibits no distension. There is no hepatosplenomegaly. There is no tenderness. There is no rigidity and no guarding.  Genitourinary:     Genitourinary Comments: Fullness and TTP of R buttock  Musculoskeletal: Normal range of motion. She exhibits no edema, tenderness or deformity.  Lymphadenopathy:    She has no cervical adenopathy.  Neurological: She is alert and oriented to person, place, and time.  Skin: Skin is warm and dry. No rash noted. She is not diaphoretic.   Psychiatric: She has a normal mood and affect.  Nursing note and vitals reviewed.   Results for orders placed or performed during the hospital encounter of 07/16/18 (from the past 48 hour(s))  CBC with Differential     Status: Abnormal   Collection Time: 07/16/18 12:06 PM  Result Value Ref Range   WBC 13.6 (H) 4.0 - 10.5 K/uL   RBC 4.55 3.87 - 5.11 MIL/uL   Hemoglobin 12.5 12.0 - 15.0 g/dL   HCT 16.140.1 09.636.0 - 04.546.0 %   MCV 88.1 80.0 - 100.0 fL   MCH 27.5 26.0 - 34.0 pg   MCHC 31.2 30.0 - 36.0 g/dL   RDW 40.913.7 81.111.5 - 91.415.5 %   Platelets 353 150 - 400 K/uL   nRBC 0.0 0.0 - 0.2 %   Neutrophils Relative % 74 %   Neutro Abs 9.9 (H) 1.7 - 7.7 K/uL   Lymphocytes Relative 19 %   Lymphs Abs 2.6 0.7 - 4.0 K/uL   Monocytes Relative 7 %   Monocytes Absolute 1.0 0.1 - 1.0 K/uL   Eosinophils Relative 0 %   Eosinophils Absolute 0.1 0.0 - 0.5 K/uL   Basophils Relative 0 %   Basophils Absolute 0.0 0.0 - 0.1 K/uL   Immature Granulocytes 0 %   Abs Immature Granulocytes 0.06 0.00 - 0.07 K/uL    Comment: Performed at [facility], [address] [place], [place] [address]  Basic metabolic panel     Status: None   Collection Time: 07/16/18 12:06 PM  Result Value Ref Range   Sodium 136 135 - 145 mmol/L   Potassium 3.9 3.5 - 5.1 mmol/L   Chloride 102 98 - 111 mmol/L   CO2 27 22 - 32 mmol/L   Glucose, Bld 78 70 - 99 mg/dL   BUN 8 6 - 20 mg/dL   Creatinine, Ser 6.210.70 0.44 - 1.00 mg/dL   Calcium 9.5 8.9 - 30.810.3 mg/dL   GFR calc non Af Amer >60 >60 mL/min   GFR calc Af Amer >60 >60 mL/min   Anion gap 7 5 - 15    Comment: Performed at [facility], [address] [place], [place] [address]  POC Urine Pregnancy, ED (do NOT order at [facility])     Status: None   Collection Time: 07/16/18  1:52 PM  Result Value Ref Range   Preg Test, Ur NEGATIVE NEGATIVE    Comment:        THE SENSITIVITY OF THIS METHODOLOGY IS >24 mIU/mL    Ct Pelvis W Contrast  Result Date: 07/16/2018 CLINICAL DATA:  Hx  of Hidradenitis Suppurativa; pt has had rectal pain x 4 days. EXAM: CT PELVIS WITH CONTRAST TECHNIQUE: Multidetector CT imaging of the pelvis was performed using the standard protocol following the bolus administration of intravenous contrast. CONTRAST:  <See Chart> OMNIPAQUE IOHEXOL 300 MG/ML  SOLN COMPARISON:  None. FINDINGS: Perineum/soft tissues: There is inflammation along the posterior margin the bones, surrounding a small irregular fluid collection, which extends from the subcutaneous tissues the right inferior/medial buttock just below and posterior to the anus, superiorly to the region of the junction of the rectum and anus, below the levator sling. The  fluid collection measures approximately 3.1 x 1.7 x 3.3 cm. There is no inflammation fluid collection above the levator sling. Urinary Tract:  No abnormality visualized. Bowel: No abnormality of the rectum or visualized small bowel colon. Normal appendix visualized. Vascular/Lymphatic: No enlarged lymph nodes. No vascular abnormality. Reproductive:  Uterus and adnexa unremarkable. Other:  None Musculoskeletal: No skeletal abnormality. IMPRESSION: 1. Small posterior perianal collection consistent with a perianal abscess with adjacent inflammation. The abscess and inflammation lies inferior to the levator sling. 2. No other abnormality. Electronically Signed   By: Amie Portland M.D.   On: 07/16/2018 15:03      Assessment/Plan Active Problems:   Perirectal abscess  Perirectal abscess - OR tomorrow  FEN: reg diet, NPO at midnight VTE: SCD's, lovenox on hold til POD1  ID: [email or web address]  Foley: none Follow up: TBD  Plan: admit and OR tomorrow for I&D   Jerre Simon, [facility] 07/16/2018, 4:28 PM Pager: [phone] Consults: [phone] Mon-Fri 7:00 am-4:30 pm Sat-Sun 7:00 am-11:30 am

## 2018-07-16 NOTE — ED Provider Notes (Signed)
27 year old female received at sign out from [place] pending surgery consult. Per his HPI:   "27 year old female presents today with complaints of rectal pain.  Patient notes over the last week she has had worsening pain in her rectum and gluteus.  She denies any nodules or swelling in the area, denies any fever.  She notes a history of pilonidal abscess, hidradenitis.  Patient denies any blood her pus in her bowel movements.  She has been using suppository hemorrhoid medicine without improvement in symptoms."  Physical Exam  BP (!) 122/100 (BP Location: Right Arm)   Pulse (!) 105   Temp 99 F (37.2 C) (Oral)   Resp 16   LMP 06/25/2018 (Exact Date)   SpO2 100%   Physical Exam  NAD.  ED Course/Procedures     Procedures  MDM   27 year old female received signout from PA Hedges pending surgical consult for rectal pain after CT demonstrated a perianal abscess.  Surgery will admit. The patient appears reasonably stabilized for admission considering the current resources, flow, and capabilities available in the ED at this time, and I doubt any other [facility] requiring further screening and/or treatment in the ED prior to admission.        Barkley BoardsMcDonald, Doran Nestle A, PA-C 07/16/18 1733    Gwyneth SproutPlunkett, Whitney, MD 07/17/18 2112

## 2018-07-16 NOTE — ED Notes (Signed)
Pt notified nurse of feeling of anxiety. RN paged admitting MD Janee Mornhompson. See new orders

## 2018-07-17 DIAGNOSIS — T490X5A Adverse effect of local antifungal, anti-infective and anti-inflammatory drugs, initial encounter: Secondary | ICD-10-CM | POA: Diagnosis not present

## 2018-07-17 DIAGNOSIS — K611 Rectal abscess: Secondary | ICD-10-CM | POA: Diagnosis not present

## 2018-07-17 SURGERY — INCISION AND DRAINAGE, ABSCESS
Anesthesia: General

## 2018-07-20 ENCOUNTER — Ambulatory Visit: Payer: Self-pay | Admitting: *Deleted

## 2018-07-20 NOTE — Telephone Encounter (Signed)
Summary: rectal pain   Pt had surgery on 07/16/18 for rectal abscess, and is having some discomfort/pain. Pt states she is not bleeding, but is having a little discharge. Pt states she was told some that discharge is normal. She would like a call back to discuss suggestions for pain relief as there are no available appts with [facility]. Please call pt to advise.      Patient left local hospital and went to [facility]- she had surgery there. Patient has not contacted the surgeon for follow up. Advised patient she needs to contact them to get follow up and make sure she is healing properly. Discussed use of stool softeners as recommended and rinsing the perianal area with warm water for comfort. She is going to call them now and call us back with the response- she is going to need them for her FMLA as well.  Made hospital follow up appointment with PCP for other things she is concerned about- she states benign lesions were seen on her liver- she will bring the report.   Reason for Disposition . SEVERE rectal pain (e.g., excruciating, unable to have a bowel movement)    Patient has not attempted to call surgeon who did surgery- patient has discharge paperwork from [facility] and she will call them now- she is going to call us back to report the response from them. She will do whatever they tell her to do because she will need them to handle her FMLA paperwork.  Answer Assessment - Initial Assessment Questions 1. SYMPTOM:  "What's the main symptom you're concerned about?" (e.g., pain, itching, swelling, rash)     Rectal pain after I/D procedure 2. ONSET: "When did the pain start  start?"     Patient states her pain is not better- even with narcotic pain medication 3. RECTAL PAIN: "Do you have any pain around your rectum?" "How bad is the pain?"  (Scale 1-10; or mild, moderate, severe)  - MILD (1-3): doesn't interfere with normal activities   - MODERATE (4-7): interferes with normal activities or awakens  from sleep, limping   - SEVERE (8-10): excruciating pain, unable to have a bowel movement      10- severe 4. RECTAL ITCHING: "Do you have any itching in this area?" "How bad is the itching?"  (Scale 1-10; or mild, moderate, severe)  - MILD - doesn't interfere with normal activities   - MODERATE - SEVERE: interferes with normal activities or awakens from sleep     no 5. CONSTIPATION: "Do you have constipation?" If so, "How bad is it?"     No BM since 12/1 6. CAUSE: "What do you think is causing the anus symptoms?"     Surgical pain 7. OTHER SYMPTOMS: "Do you have any other symptoms?"  (e.g., rectal bleeding, abdominal pain, vomiting, fever)     Normal color discharge 8. PREGNANCY: "Is there any chance you are pregnant?" "When was your last menstrual period?"     Patient is not pregnant  Protocols used: [id number]

## 2018-07-21 ENCOUNTER — Telehealth: Payer: Self-pay | Admitting: Family Medicine

## 2018-07-21 NOTE — Telephone Encounter (Signed)
Pt called to follow up with a nurse about her surgery tomorrow.  Please advise

## 2018-07-22 ENCOUNTER — Encounter (HOSPITAL_COMMUNITY): Payer: Self-pay | Admitting: Emergency Medicine

## 2018-07-22 ENCOUNTER — Emergency Department (HOSPITAL_COMMUNITY)
Admission: EM | Admit: 2018-07-22 | Discharge: 2018-07-22 | Disposition: A | Discharge status: Other Acute Inpatient Hospital | Payer: BLUE CROSS/BLUE SHIELD | Attending: Emergency Medicine | Admitting: Emergency Medicine

## 2018-07-22 DIAGNOSIS — K611 Rectal abscess: Secondary | ICD-10-CM | POA: Insufficient documentation

## 2018-07-22 DIAGNOSIS — F17210 Nicotine dependence, cigarettes, uncomplicated: Secondary | ICD-10-CM | POA: Diagnosis not present

## 2018-07-22 DIAGNOSIS — R509 Fever, unspecified: Secondary | ICD-10-CM | POA: Diagnosis not present

## 2018-07-22 DIAGNOSIS — Z9889 Other specified postprocedural states: Secondary | ICD-10-CM | POA: Diagnosis not present

## 2018-07-22 DIAGNOSIS — R000 Tachycardia, unspecified: Secondary | ICD-10-CM | POA: Diagnosis not present

## 2018-07-22 LAB — URINALYSIS, ROUTINE W REFLEX MICROSCOPIC
Bacteria, UA: NONE SEEN
Bilirubin Urine: NEGATIVE
Glucose, UA: NEGATIVE mg/dL
Hgb urine dipstick: NEGATIVE
Ketones, ur: NEGATIVE mg/dL
Leukocytes, UA: NEGATIVE
Nitrite: NEGATIVE
Protein, ur: 30 mg/dL — AB
Specific Gravity, Urine: 1.026 (ref 1.005–1.030)
pH: 6 (ref 5.0–8.0)

## 2018-07-22 LAB — CBC WITH DIFFERENTIAL/PLATELET
Band Neutrophils: 0 %
Basophils Absolute: 0 10*3/uL (ref 0.0–0.1)
Basophils Relative: 0 %
Blasts: 0 %
Eosinophils Absolute: 0 10*3/uL (ref 0.0–0.5)
Eosinophils Relative: 0 %
HCT: 37.1 % (ref 36.0–46.0)
Hemoglobin: 11.2 g/dL — ABNORMAL LOW (ref 12.0–15.0)
LYMPHS ABS: 2.4 10*3/uL (ref 0.7–4.0)
Lymphocytes Relative: 9 %
MCH: 26.5 pg (ref 26.0–34.0)
MCHC: 30.2 g/dL (ref 30.0–36.0)
MCV: 87.9 fL (ref 80.0–100.0)
MYELOCYTES: 0 %
Metamyelocytes Relative: 0 %
Monocytes Absolute: 1.6 10*3/uL — ABNORMAL HIGH (ref 0.1–1.0)
Monocytes Relative: 6 %
Neutro Abs: 23 10*3/uL — ABNORMAL HIGH (ref 1.7–7.7)
Neutrophils Relative %: 85 %
Other: 0 %
PLATELETS: 440 10*3/uL — AB (ref 150–400)
Promyelocytes Relative: 0 %
RBC: 4.22 MIL/uL (ref 3.87–5.11)
RDW: 13.4 % (ref 11.5–15.5)
WBC: 27 10*3/uL — ABNORMAL HIGH (ref 4.0–10.5)
nRBC: 0 % (ref 0.0–0.2)
nRBC: 0 /100 WBC

## 2018-07-22 LAB — COMPREHENSIVE METABOLIC PANEL
ALK PHOS: 65 U/L (ref 38–126)
ALT: 27 U/L (ref 0–44)
AST: 22 U/L (ref 15–41)
Albumin: 3.3 g/dL — ABNORMAL LOW (ref 3.5–5.0)
Anion gap: 12 (ref 5–15)
BUN: 7 mg/dL (ref 6–20)
CALCIUM: 9.1 mg/dL (ref 8.9–10.3)
CO2: 21 mmol/L — AB (ref 22–32)
Chloride: 102 mmol/L (ref 98–111)
Creatinine, Ser: 0.78 mg/dL (ref 0.44–1.00)
GFR calc Af Amer: >60 mL/min (ref >60)
GFR calc non Af Amer: >60 mL/min (ref >60)
Glucose, Bld: 110 mg/dL — ABNORMAL HIGH (ref 70–99)
Potassium: 3.7 mmol/L (ref 3.5–5.1)
Sodium: 135 mmol/L (ref 135–145)
Total Bilirubin: 0.6 mg/dL (ref 0.3–1.2)
Total Protein: 7.9 g/dL (ref 6.5–8.1)

## 2018-07-22 LAB — I-STAT CG4 LACTIC ACID, ED: Lactic Acid, Venous: 1.16 mmol/L (ref 0.5–1.9)

## 2018-07-22 LAB — I-STAT BETA HCG BLOOD, ED (MC, WL, AP ONLY): I-stat hCG, quantitative: <5 m[IU]/mL (ref <5)

## 2018-07-22 MED ORDER — LACTATED RINGERS IV BOLUS
1000.0000 mL | Freq: Once | INTRAVENOUS | Status: AC
  Administered 2018-07-22: 1000 mL via INTRAVENOUS

## 2018-07-22 MED ORDER — FENTANYL CITRATE (PF) 100 MCG/2ML IJ SOLN
100.0000 ug | Freq: Once | INTRAMUSCULAR | Status: AC
  Administered 2018-07-22: 100 ug via INTRAMUSCULAR
  Filled 2018-07-22: qty 2

## 2018-07-22 MED ORDER — VANCOMYCIN HCL IN DEXTROSE 1-5 GM/200ML-% IV SOLN: 1000.0000 mg | Freq: Once | INTRAVENOUS | Status: DC

## 2018-07-22 MED ORDER — METRONIDAZOLE IN NACL 5-0.79 MG/ML-% IV SOLN
500.0000 mg | Freq: Three times a day (TID) | INTRAVENOUS | Status: DC
  Administered 2018-07-22: 500 mg via INTRAVENOUS
  Filled 2018-07-22: qty 100

## 2018-07-22 MED ORDER — SODIUM CHLORIDE 0.9 % IV SOLN
2.0000 g | INTRAVENOUS | Status: DC
  Filled 2018-07-22: qty 20

## 2018-07-22 MED ORDER — VANCOMYCIN HCL IN DEXTROSE 750-5 MG/150ML-% IV SOLN
750.0000 mg | Freq: Three times a day (TID) | INTRAVENOUS | Status: DC
  Filled 2018-07-22: qty 150

## 2018-07-22 MED ORDER — VANCOMYCIN HCL 10 G IV SOLR
2000.0000 mg | Freq: Once | INTRAVENOUS | Status: AC
  Administered 2018-07-22: 2000 mg via INTRAVENOUS
  Filled 2018-07-22: qty 2000

## 2018-07-22 NOTE — ED Notes (Addendum)
Dr. Clarene DukeLittle ( EDP) explained plan of care /transfer to [facility] to pt. and family , pt. signed consent form to transport to [facility] , IV LR infusing , Vancomycin and Flagyl IV infusing .

## 2018-07-22 NOTE — ED Notes (Signed)
EDP notified that IV nurse is unable to establish IV access .

## 2018-07-22 NOTE — ED Notes (Signed)
EDP explained plan of care to pt. and family.

## 2018-07-22 NOTE — ED Notes (Signed)
Report given to April RN Customer service manager( Charge nurse) at [facility] ER on pt.'s condition/transfer , [facility] notified by Diplomatic Services operational officersecretary for transport.

## 2018-07-22 NOTE — Progress Notes (Signed)
Pharmacy Antibiotic Note  Melanie Townsend is a 27 y.o. female admitted on 07/22/2018 with cellulitis.  Pharmacy has been consulted for vancomycin dosing.  Plan: Give vancomycin 2g IV x 1, then start vancomycin 750mg  IV Q8h  Continue ceftriaxone 2g IV Q24h Continue Flagyl 500mg  IV Q8h Monitor clinical picture, renal function, vanc levels prn F/U C&S, abx deescalation / LOT   Height: 5\' 6"  (167.6 cm) Weight: 240 lb (108.9 kg) IBW/kg (Calculated) : 59.3  Temp (24hrs), Avg:100.6 F (38.1 C), Min:100.6 F (38.1 C), Max:100.6 F (38.1 C)  Recent Labs  Lab 07/16/18 1206 07/22/18 2050  WBC 13.6*  --   CREATININE 0.70  --   LATICACIDVEN  --  1.16    Estimated Creatinine Clearance: 134.2 mL/min (by C-G formula based on SCr of 0.7 mg/dL).    Allergies  Allergen Reactions  . Iodine Hives       . Shellfish Allergy Hives   Thank you for allowing pharmacy to be a part of this patient's care.  Melanie Townsend,Melanie Townsend 07/22/2018 9:00 PM

## 2018-07-22 NOTE — ED Notes (Signed)
IV team will get 2nd set of blood cultures.

## 2018-07-22 NOTE — ED Provider Notes (Signed)
[facility] EMERGENCY DEPARTMENT Provider Note   CSN: [id number] Arrival date & time: 07/22/18  2016     History   Chief Complaint No chief complaint on file.   HPI Allina Riches is a 27 y.o. female.  27yo F w/ PMH including hidradenitis, PMDD, headaches who p/w fever and rectal pain. PT was seen here on 12/3 for rectal pain and was diagnosed w/ perianal abscess. She was seen by surgery and scheduled to go to the OR but decided to leave AMA and go to [facility], where she was admitted and had operative I&D. Packing eventually removed and she was discharged home.  She has continued to have progressively worsening pain in this area on her right buttock as well as continued purulent drainage.  Yesterday she began developing fevers at home and has continued to have fevers today despite taking Tylenol.  She has had nausea but no vomiting or diarrhea.  She has taken MiraLAX for constipation.  She has had mild sore throat and cough since OR.  No urinary symptoms.  The history is provided by the patient.    Past Medical History:  Diagnosis Date  . Headache associated with hormonal factors   . Hidradenitis suppurativa of right axilla 04/2018  . PMDD (premenstrual dysphoric disorder)     Patient Active Problem List   Diagnosis Date Noted  . Perirectal abscess 07/16/2018  . Class 2 obesity due to excess calories without serious comorbidity with body mass index (BMI) of 38.0 to 38.9 in adult 11/10/2017  . Hidradenitis axillaris 09/25/2017    Past Surgical History:  Procedure Laterality Date  . HYDRADENITIS EXCISION Left 02/26/2018   Procedure: EXCISION HIDRADENITIS TO THE LEFT AXILLA;  Surgeon: Louisa Second, MD;  Location: [facility];  Service: Plastics;  Laterality: Left;  . HYDRADENITIS EXCISION Right 04/30/2018   Procedure: EXCISION HIDRADENITIS RIGHT AXILLA;  Surgeon: Louisa Second, MD;  Location: [facility];  Service:  Plastics;  Laterality: Right;  . PILONIDAL CYST EXCISION       OB History   None      Home Medications    Prior to Admission medications   Medication Sig Start Date End Date Taking? Authorizing Provider  acetaminophen (TYLENOL) 500 MG tablet Take 1,000 mg by mouth every 6 (six) hours as needed (pain).   Yes [provider]  cyclobenzaprine (FLEXERIL) 10 MG tablet Take 10 mg by mouth at bedtime as needed for muscle spasms (sleep).   Yes [provider]  docusate sodium (COLACE) 100 MG capsule Take 100 mg by mouth 2 (two) times daily as needed for mild constipation.   Yes [provider]  HYDROcodone-acetaminophen (NORCO/VICODIN) 5-325 MG tablet Take 1 tablet by mouth every 6 (six) hours as needed for moderate pain.   Yes [provider]  ibuprofen (ADVIL,MOTRIN) 800 MG tablet Take 800 mg by mouth every 8 (eight) hours as needed (pain).   Yes [provider]  polyethylene glycol (MIRALAX / GLYCOLAX) packet Take 17 g by mouth daily as needed (constipation). Mix in 8 oz liquid and drink   Yes [provider]  PRESCRIPTION MEDICATION Take 600 mg by mouth See admin instructions. Boric Acid 600 mg capsules - insert one capsule (600 mg) vaginally daily at bedtime for 3 days following end of menstrual cycle   Yes [provider]  traMADol (ULTRAM) 50 MG tablet Take 50 mg by mouth every 6 (six) hours as needed (pain).   Yes [provider]  hydrocortisone (ANUSOL-HC) 25 MG suppository Place 1 suppository (25 mg total) rectally 2 (two) times daily. Patient not taking: Reported on 07/22/2018 07/14/18   Lew Dawes, PA-C    Family History Family History  Problem Relation Age of Onset  . Crohn's disease Father   . Diabetes Maternal Uncle   . Diabetes Maternal Grandmother   . Diabetes Paternal Grandmother     Social History Social History   Tobacco Use  . Smoking status: Current Some Day Smoker    Packs/day: 0.33     Years: 5.00    Pack years: 1.65    Types: Cigarettes  . Smokeless tobacco: Never Used  Substance Use Topics  . Alcohol use: Yes    Comment: occasionally  . Drug use: No     Allergies   Iodine and Shellfish allergy   Review of Systems Review of Systems All other systems reviewed and are negative except that which was mentioned in HPI   Physical Exam Updated Vital Signs BP 118/62   Pulse 78   Temp 98.6 F (37 C)   Resp 14   Ht 5\' 6"  (1.676 m)   Wt 108.9 kg   LMP 06/25/2018 (Exact Date)   SpO2 100%   BMI 38.74 kg/m   Physical Exam  Constitutional: She is oriented to person, place, and time. She appears well-developed and well-nourished. No distress.  Crying and in distress due to pain and IV placement  HENT:  Head: Normocephalic and atraumatic.  Eyes: Conjunctivae are normal.  Neck: Neck supple.  Cardiovascular: Regular rhythm and normal heart sounds. Tachycardia present.  No murmur heard. Pulmonary/Chest: Effort normal and breath sounds normal.  Abdominal: Soft. Bowel sounds are normal. She exhibits no distension. There is no tenderness.  Genitourinary:  Genitourinary Comments: Induration and warmth of R buttock with ~1.5cm incision at medial buttock near anus, copious foul-smelling purulent drainage, very tender to palpation  Musculoskeletal: She exhibits no edema.  Neurological: She is alert and oriented to person, place, and time.  Fluent speech  Skin: Skin is warm and dry.  Psychiatric:  anxious  Nursing note and vitals reviewed. Chaperone was present during exam.    ED Treatments / Results  Labs (all labs ordered are listed, but only abnormal results are displayed) Labs Reviewed  COMPREHENSIVE METABOLIC PANEL - Abnormal; Notable for the following components:      Result Value   CO2 21 (*)    Glucose, Bld 110 (*)    Albumin 3.3 (*)    All other components within normal limits  CBC WITH DIFFERENTIAL/PLATELET - Abnormal; Notable for the following  components:   WBC 27.0 (*)    Hemoglobin 11.2 (*)    Platelets 440 (*)    Neutro Abs 23.0 (*)    Monocytes Absolute 1.6 (*)    All other components within normal limits  URINALYSIS, ROUTINE W REFLEX MICROSCOPIC - Abnormal; Notable for the following components:   Protein, ur 30 (*)    All other components within normal limits  URINE CULTURE  CULTURE, BLOOD (ROUTINE X 2)  CULTURE, BLOOD (ROUTINE X 2)  I-STAT CG4 LACTIC ACID, ED  I-STAT BETA HCG BLOOD, ED ([facility], [facility], [facility] ONLY)  I-STAT CG4 LACTIC ACID, ED    EKG None  Radiology No results found.  Procedures Procedures (including critical care time)  Medications Ordered in ED Medications  cefTRIAXone (ROCEPHIN) 2 g in sodium chloride 0.9 % 100 mL IVPB (has no administration in time range)  metroNIDAZOLE (FLAGYL) IVPB 500 mg (500 mg Intravenous New Bag/Given 07/22/18 2228)  vancomycin (VANCOCIN) 2,000 mg in sodium chloride 0.9 % 500 mL IVPB (2,000 mg Intravenous New Bag/Given 07/22/18 2228)  vancomycin (VANCOCIN) IVPB 750 mg/150 ml premix (has no administration in time range)  lactated ringers bolus 1,000 mL (1,000 mLs Intravenous New Bag/Given 07/22/18 2226)  fentaNYL (SUBLIMAZE) injection 100 mcg (100 mcg Intramuscular Given 07/22/18 2102)     Initial Impression / Assessment and Plan / ED Course  I have reviewed the triage vital signs and the nursing notes.  Pertinent labs  that were available during my care of the patient were reviewed by me and considered in my medical decision making (see chart for details).     PT in mild distress due to pain but non-toxic on exam, febrile but remainder of VS stable. No abd tenderness.  The appearance of her postsurgical site on right buttock is concerning for active infection/treatment failure.  Lab work shows reassuring CMP, UA without evidence of infection, CBC with WBC 27,000, negative pregnancy test.  Gave the patient broad-spectrum antibiotics including MRSA and gram-negative  coverage--Vanc, flagyl, CTX.  Patient was extremely difficult IV access and despite multiple ultrasound IV attempts we were only able to obtain small IV in the hand which is inadequate for IV contrasted CT study of pelvis.  Because the [facility], [facility], Dr. Daphine DeutscherMartin, who is excepted the patient in ED to ED transfer.  They will decide on imaging at that time.  Her vital signs are stable and fevers improved at time of transfer.  Patient in agreement with plan.  [facility].  Final Clinical Impressions(s) / ED Diagnoses   Final diagnoses:  Peri-rectal abscess    ED Discharge Orders    None       Yotam Rhine, Ambrose Finlandachel Morgan, MD 07/22/18 2320

## 2018-07-22 NOTE — ED Notes (Signed)
2nd IV nurse at bedside attempting to insert peripheral IV access.  

## 2018-07-22 NOTE — ED Notes (Signed)
Report given to [facility]. 

## 2018-07-22 NOTE — ED Triage Notes (Signed)
Pt from home w/ a c/o a post op fever. Pt reported she had an abscess drained on Friday and was admitted to the hospital. She was given opiate pain meds became constipated and was given Miralax for tx. Pt began experiencing worsening pain and became febrile after having a BM. She has taken repeated doses of Tylenol for tx. Pt is hot to touch, temp of 100.6, and a HR of 140s standing w/ a weak radial pulse.

## 2018-07-22 NOTE — ED Notes (Addendum)
Unable to establish IV access at this time despite several attempts , IV team consult ordered , 1st blood culture collected by phlebotomist . EDP notified.

## 2018-07-22 NOTE — ED Notes (Addendum)
[facility]  Form , transfer consent , documents and MD notes printed and given to [facility] .

## 2018-07-22 NOTE — ED Notes (Signed)
IV nurse at bedside attempting to insert angiocath with blood draw.

## 2018-07-23 DIAGNOSIS — R509 Fever, unspecified: Secondary | ICD-10-CM | POA: Diagnosis not present

## 2018-07-23 DIAGNOSIS — K611 Rectal abscess: Secondary | ICD-10-CM | POA: Diagnosis not present

## 2018-07-23 DIAGNOSIS — Z888 Allergy status to other drugs, medicaments and biological substances status: Secondary | ICD-10-CM | POA: Diagnosis not present

## 2018-07-24 LAB — URINE CULTURE: Culture: NO GROWTH

## 2018-07-26 ENCOUNTER — Telehealth: Payer: Self-pay | Admitting: Family Medicine

## 2018-07-26 DIAGNOSIS — Z4800 Encounter for change or removal of nonsurgical wound dressing: Secondary | ICD-10-CM | POA: Diagnosis not present

## 2018-07-26 DIAGNOSIS — K611 Rectal abscess: Secondary | ICD-10-CM | POA: Diagnosis not present

## 2018-07-26 NOTE — Telephone Encounter (Signed)
Copied from CRM [phone]. Topic: Quick Communication - Home Health Verbal Orders >> Jul 26, 2018  2:24 PM Terisa Starraylor, Brittany L wrote: Caller/Agency: Marcelle SmilingNatasha RN with [facility] Callback Number: [phone] or [phone] ( both voicemail's are confidential ) Requesting OT/PT/Skilled Nursing/Social Work: Skilled Nursing Frequency: 1 week 1, 3 times a week for 1 week and twice a week for 1 week.  To treat and evaluate for wound care on peri rectal abscess. ( wet to dry and being packed )

## 2018-07-27 ENCOUNTER — Telehealth: Payer: Self-pay | Admitting: Emergency Medicine

## 2018-07-27 LAB — CULTURE, BLOOD (ROUTINE X 2): Culture: NO GROWTH

## 2018-07-27 NOTE — Telephone Encounter (Signed)
[facility] called wanting verbal orders for patient to PT/OT, skilled Nursing and would care for patient. Spoke to Dr Creta LevinStallings and ok to have PT/Ot, skilled nursing and would care as instructed in  notes

## 2018-07-30 NOTE — Telephone Encounter (Signed)
Verbal given 

## 2018-08-01 ENCOUNTER — Inpatient Hospital Stay: Payer: Self-pay | Admitting: Family Medicine

## 2018-08-01 NOTE — Telephone Encounter (Signed)
LVM for pt to call and see if we could schedule an appt. If pt calls back, please try to find an appt for her. Thank you!

## 2018-08-09 ENCOUNTER — Telehealth: Payer: Self-pay | Admitting: Family Medicine

## 2018-08-09 DIAGNOSIS — Z4800 Encounter for change or removal of nonsurgical wound dressing: Secondary | ICD-10-CM | POA: Diagnosis not present

## 2018-08-09 DIAGNOSIS — K611 Rectal abscess: Secondary | ICD-10-CM | POA: Diagnosis not present

## 2018-08-09 NOTE — Telephone Encounter (Signed)
Copied from CRM [phone]. Topic: General - Other >> Aug 09, 2018 11:57 AM Elliot GaultBell, Tiffany M wrote: Caller name: Marcelle Smilingatasha  Relation to pt: RN from [facility]  Call back number: [phone]    Reason for call:  Agency discharging patient 08/09/18. Patient has an abscess underneath left armpit, RN unsure if PCP would like patient seen prior to her 08/22/2018 appointment, please advise

## 2018-08-22 ENCOUNTER — Other Ambulatory Visit: Payer: Self-pay

## 2018-08-22 ENCOUNTER — Ambulatory Visit (INDEPENDENT_AMBULATORY_CARE_PROVIDER_SITE_OTHER): Payer: BLUE CROSS/BLUE SHIELD | Admitting: Family Medicine

## 2018-08-22 ENCOUNTER — Encounter: Payer: Self-pay | Admitting: Family Medicine

## 2018-08-22 VITALS — BP 109/71 | HR 86 | Temp 98.6°F | Resp 17 | Ht 66.0 in | Wt 246.7 lb

## 2018-08-22 DIAGNOSIS — F3281 Premenstrual dysphoric disorder: Secondary | ICD-10-CM | POA: Diagnosis not present

## 2018-08-22 DIAGNOSIS — K611 Rectal abscess: Secondary | ICD-10-CM | POA: Diagnosis not present

## 2018-08-22 DIAGNOSIS — L732 Hidradenitis suppurativa: Secondary | ICD-10-CM | POA: Diagnosis not present

## 2018-08-22 NOTE — Patient Instructions (Signed)
Premenstrual Syndrome  Premenstrual syndrome (PMS) is a group of physical, emotional, and behavioral symptoms that affect women of childbearing age. PMS starts 1-2 weeks before the start of a woman's period and goes away a few days after the period starts. It often recurs in a predictable pattern.  PMS can range from mild to severe. When it is severe, it is called premenstrual dysphoric disorder (PMDD). PMS can interfere in many ways with normal daily activities.  What are the causes?  The cause of this condition is not known, but it seems to be related to hormone changes that happen before menstruation.  What are the signs or symptoms?  Symptoms of this condition often happen every month. They go away completely after your period starts. Physical symptoms include:  · Bloating.  · Breast pain.  · Headaches.  · Extreme fatigue.  · Backaches.  · Swelling of the hands and feet.  · Weight gain.  · Hot flashes.  Emotional and behavioral symptoms include:  · Mood swings.  · Depression.  · Angry outbursts.  · Irritability.  · Anxiety.  · Crying spells.  · Food cravings or appetite changes.  · Changes in sexual desire.  · Confusion.  · Aggression.  · Social withdrawal.  · Poor concentration.  How is this diagnosed?  This condition is diagnosed if symptoms of PMS:  · Are present in the 5 days before your period starts.  · End within 4 days after your period starts.  · Happen at least 3 months in a row.  · Interfere with some of your normal activities.  Other conditions that can cause some of these symptoms must be ruled out before PMS can be diagnosed.  How is this treated?  This condition may be treated by:  · Maintaining a healthy lifestyle. This includes eating a balanced diet and exercising regularly.  · Taking medicines. Medicines can help relieve symptoms such as cramps, aches, pains, headaches, and breast tenderness. Depending on the severity of the condition, your health care provider may  recommend:  ? Over-the-counter pain medicines.  ? Prescription medicines for PMDD.  Follow these instructions at home:  Eating and drinking    · Eat a well-balanced diet.  · Avoid caffeine and alcohol.  · Limit the amount of salt and salty foods you eat. This will help lessen bloating.  · Drink enough fluid to keep your urine clear or pale yellow.  · Take a multivitamin if told to by your health care provider.  Lifestyle  · Do not use any tobacco products, such as cigarettes, chewing tobacco, and e-cigarettes. If you need help quitting, ask your health care provider.  · Exercise regularly as suggested by your health care provider.  · Get enough sleep.  · Practice relaxation techniques.  · Limit stress.  Other Instructions  · For 2-3 months, write down your symptoms, their severity, and how long they last. This will help your health care provider choose the best treatment for you.  · Take over-the-counter and prescription medicines only as told by your health care provider.  · If you are using oral contraceptive pills, use them as told by your health care provider.  This information is not intended to replace advice given to you by your health care provider. Make sure you discuss any questions you have with your health care provider.  Document Released: 07/29/2000 Document Revised: 09/02/2015 Document Reviewed: 05/01/2015  Elsevier Interactive Patient Education © 2019 [employer]

## 2018-08-22 NOTE — Progress Notes (Signed)
Established Patient Office Visit  Subjective:  Patient ID: Melanie Townsend, female    DOB: 1991-01-15  Age: 28 y.o. MRN: [id number]  CC:  Chief Complaint  Patient presents with  . Hospitalization Follow-up    rectal abscess  . Depression    scale completed 23.  Menses related depression    HPI Melanie Townsend presents for   Depression  She reports that she has been having PMDD and states that right before her period she gets extremely depressed and moody She states that she gets relief when her period goes off She states that she fluoxetine is not something she wants to take She states that she was tried on two types of antidepressants She states that she has to see a new OB/GYN She states that she has never seen Psychiatry for this. She feels like she is depressed about her body because every week she has a new boil. She states that the recurrence of the boil in her arm pit has her frustrated again. She also had a rectal abscess   Depression screen [facility] 2/9 08/22/2018 04/09/2018 10/05/2017 10/04/2017 09/13/2017  Decreased Interest 3 0 0 2 1  Down, Depressed, Hopeless 3 0 0 2 1  PHQ - 2 Score 6 0 0 4 2  Altered sleeping 3 - - 2 -  Tired, decreased energy 3 - - 1 -  Change in appetite 3 - - 1 -  Feeling bad or failure about yourself  3 - - 1 -  Trouble concentrating 3 - - 1 -  Moving slowly or fidgety/restless 1 - - 0 -  Suicidal thoughts 1 - - 0 -  PHQ-9 Score 23 - - 10 -  Difficult doing work/chores Very difficult - - Very difficult -     Past Medical History:  Diagnosis Date  . Headache associated with hormonal factors   . Hidradenitis suppurativa of right axilla 04/2018  . PMDD (premenstrual dysphoric disorder)     Past Surgical History:  Procedure Laterality Date  . HYDRADENITIS EXCISION Left 02/26/2018   Procedure: EXCISION HIDRADENITIS TO THE LEFT AXILLA;  Surgeon: Louisa Second, MD;  Location: [facility];  Service: Plastics;   Laterality: Left;  . HYDRADENITIS EXCISION Right 04/30/2018   Procedure: EXCISION HIDRADENITIS RIGHT AXILLA;  Surgeon: Louisa Second, MD;  Location: [facility];  Service: Plastics;  Laterality: Right;  . PILONIDAL CYST EXCISION      Family History  Problem Relation Age of Onset  . Crohn's disease Father   . Diabetes Maternal Uncle   . Diabetes Maternal Grandmother   . Diabetes Paternal Grandmother     Social History   Socioeconomic History  . Marital status: Single    Spouse name: Not on file  . Number of children: Not on file  . Years of education: Not on file  . Highest education level: Not on file  Occupational History  . Not on file  Social Needs  . Financial resource strain: Not on file  . Food insecurity:    Worry: Not on file    Inability: Not on file  . Transportation needs:    Medical: Not on file    Non-medical: Not on file  Tobacco Use  . Smoking status: Current Some Day Smoker    Packs/day: 0.33    Years: 5.00    Pack years: 1.65    Types: Cigarettes  . Smokeless tobacco: Never Used  Substance and Sexual Activity  . Alcohol use: Yes  Comment: occasionally  . Drug use: No  . Sexual activity: Not on file  Lifestyle  . Physical activity:    Days per week: Not on file    Minutes per session: Not on file  . Stress: Not on file  Relationships  . Social connections:    Talks on phone: Not on file    Gets together: Not on file    Attends religious service: Not on file    Active member of club or organization: Not on file    Attends meetings of clubs or organizations: Not on file    Relationship status: Not on file  . Intimate partner violence:    Fear of current or ex partner: Not on file    Emotionally abused: Not on file    Physically abused: Not on file    Forced sexual activity: Not on file  Other Topics Concern  . Not on file  Social History Narrative  . Not on file    Outpatient Medications Prior to Visit  Medication  Sig Dispense Refill  . cyclobenzaprine (FLEXERIL) 10 MG tablet Take 10 mg by mouth at bedtime as needed for muscle spasms (sleep).    . hydrocortisone (ANUSOL-HC) 25 MG suppository Place 1 suppository (25 mg total) rectally 2 (two) times Melanie. 12 suppository 0  . ibuprofen (ADVIL,MOTRIN) 800 MG tablet Take 800 mg by mouth every 8 (eight) hours as needed (pain).    Marland Kitchen PRESCRIPTION MEDICATION Take 600 mg by mouth See admin instructions. Boric Acid 600 mg capsules - insert one capsule (600 mg) vaginally Melanie at bedtime for 3 days following end of menstrual cycle    . traMADol (ULTRAM) 50 MG tablet Take 50 mg by mouth every 6 (six) hours as needed (pain).    Marland Kitchen acetaminophen (TYLENOL) 500 MG tablet Take 1,000 mg by mouth every 6 (six) hours as needed (pain).    Marland Kitchen docusate sodium (COLACE) 100 MG capsule Take 100 mg by mouth 2 (two) times Melanie as needed for mild constipation.    Marland Kitchen HYDROcodone-acetaminophen (NORCO/VICODIN) 5-325 MG tablet Take 1 tablet by mouth every 6 (six) hours as needed for moderate pain.    . polyethylene glycol (MIRALAX / GLYCOLAX) packet Take 17 g by mouth Melanie as needed (constipation). Mix in 8 oz liquid and drink     No facility-administered medications prior to visit.     Allergies  Allergen Reactions  . Iodine Hives       . Shellfish Allergy Hives    ROS Review of Systems Review of Systems  Constitutional: Negative for activity change, appetite change, chills and fever.  HENT: Negative for congestion, nosebleeds, trouble swallowing and voice change.   Respiratory: Negative for cough, shortness of breath and wheezing.   Gastrointestinal: Negative for diarrhea, nausea and vomiting.  Genitourinary: Negative for difficulty urinating, dysuria, flank pain and hematuria.  Musculoskeletal: Negative for back pain, joint swelling and neck pain.  Neurological: Negative for dizziness, speech difficulty, light-headedness and numbness.  See HPI. All other review of systems  negative.     Objective:    Physical Exam  BP 109/71 (BP Location: Right Arm, Patient Position: Sitting, Cuff Size: Normal)   Pulse 86   Temp 98.6 F (37 C) (Oral)   Resp 17   Ht 5\' 6"  (1.676 m)   Wt 246 lb 11.2 oz (111.9 kg)   LMP 08/21/2018   SpO2 96%   BMI 39.82 kg/m  Wt Readings from Last 3 Encounters:  08/22/18 246  lb 11.2 oz (111.9 kg)  07/22/18 240 lb (108.9 kg)  04/30/18 242 lb 15.2 oz (110.2 kg)   Physical Exam  Constitutional: Oriented to person, place, and time. Appears well-developed and well-nourished.  HENT:  Head: Normocephalic and atraumatic.  Eyes: Conjunctivae and EOM are normal.  Cardiovascular: Normal rate, regular rhythm, normal heart sounds and intact distal pulses.  No murmur heard. Pulmonary/Chest: Effort normal and breath sounds normal. No stridor. No respiratory distress. Has no wheezes.  Neurological: Is alert and oriented to person, place, and time.  Skin: Skin is warm. Capillary refill takes less than 2 seconds.  Psychiatric: Has a normal mood and affect. Behavior is normal. Judgment and thought content normal.    There are no preventive care reminders to display for this patient.  There are no preventive care reminders to display for this patient.  Lab Results  Component Value Date   TSH 0.89 09/13/2017   Lab Results  Component Value Date   WBC 27.0 (H) 07/22/2018   HGB 11.2 (L) 07/22/2018   HCT 37.1 07/22/2018   MCV 87.9 07/22/2018   PLT 440 (H) 07/22/2018   Lab Results  Component Value Date   NA 135 07/22/2018   K 3.7 07/22/2018   CO2 21 (L) 07/22/2018   GLUCOSE 110 (H) 07/22/2018   BUN 7 07/22/2018   CREATININE 0.78 07/22/2018   BILITOT 0.6 07/22/2018   ALKPHOS 65 07/22/2018   AST 22 07/22/2018   ALT 27 07/22/2018   PROT 7.9 07/22/2018   ALBUMIN 3.3 (L) 07/22/2018   CALCIUM 9.1 07/22/2018   ANIONGAP 12 07/22/2018   GFR 154.22 09/13/2017   Lab Results  Component Value Date   CHOL 135 09/13/2017   Lab Results   Component Value Date   HDL 36.30 (L) 09/13/2017   Lab Results  Component Value Date   LDLCALC 84 09/13/2017   Lab Results  Component Value Date   TRIG 73.0 09/13/2017   Lab Results  Component Value Date   CHOLHDL 4 09/13/2017   Lab Results  Component Value Date   HGBA1C 5.6 09/13/2017      Assessment & Plan:   Problem List Items Addressed This Visit      Musculoskeletal and Integument   Hidradenitis axillaris - Primary   Relevant Orders   Ambulatory referral to Dermatology     Other   Perirectal abscess  - continue surgery recommendations Advised pt to follow up with Dermatology to see if there is another underlying cause related to her recurrent boils and perirectal abscess Surgical resection of the glands of the axillae did not provide a cure thus will try a different approach to see what Dermatology has to offer in this challenging situation.   Relevant Orders   Ambulatory referral to Dermatology    Other Visit Diagnoses    PMDD (premenstrual dysphoric disorder)    -  Discussed treatment with mood stabilizers to prevent recurring symptoms    Relevant Orders   Ambulatory referral to Psychiatry      A total of 30 minutes were spent face-to-face with the patient during this encounter and over half of that time was spent on counseling and coordination of care.  No orders of the defined types were placed in this encounter.   Follow-up: No follow-ups on file.    Doristine BosworthZoe A Aloise Copus, MD

## 2018-09-10 DIAGNOSIS — Z124 Encounter for screening for malignant neoplasm of cervix: Secondary | ICD-10-CM | POA: Diagnosis not present

## 2018-09-10 DIAGNOSIS — Z113 Encounter for screening for infections with a predominantly sexual mode of transmission: Secondary | ICD-10-CM | POA: Diagnosis not present

## 2018-09-10 DIAGNOSIS — Z01419 Encounter for gynecological examination (general) (routine) without abnormal findings: Secondary | ICD-10-CM | POA: Diagnosis not present

## 2018-09-10 DIAGNOSIS — Z3040 Encounter for surveillance of contraceptives, unspecified: Secondary | ICD-10-CM | POA: Diagnosis not present

## 2018-09-10 DIAGNOSIS — N898 Other specified noninflammatory disorders of vagina: Secondary | ICD-10-CM | POA: Diagnosis not present

## 2018-09-10 DIAGNOSIS — Z6839 Body mass index (BMI) 39.0-39.9, adult: Secondary | ICD-10-CM | POA: Diagnosis not present

## 2018-10-01 ENCOUNTER — Telehealth: Payer: Self-pay | Admitting: Family Medicine

## 2018-10-01 NOTE — Telephone Encounter (Signed)
Tried to call pt per CRM [phone]. Pt wants to be seen earlier that the 3/19 appt that is scheduled. Her VM was not setup to allow me to leave a message and pt does not have mychart. If pt calls back, please call [place] so we may set her up with a sameday with Dr. Creta Levin for FMLA. You may ask for Gaston Islam or Brandi.   sameday ok by Dr. Creta Levin - FMLA recert

## 2018-10-08 ENCOUNTER — Telehealth: Payer: Self-pay | Admitting: Family Medicine

## 2018-10-08 NOTE — Telephone Encounter (Signed)
Called and spoke with pt regarding their appt scheduled on 3/19 with Dr. Creta Levin. Pt was wanting to have an earlier appt for FMLA. Dr. Creta Levin ok'ed a sameday. I was able to schedule 3/4 with Dr.Stallings. I advised of time and late policy. Pt acknowledged.

## 2018-10-17 ENCOUNTER — Encounter: Payer: Self-pay | Admitting: Family Medicine

## 2018-10-17 ENCOUNTER — Ambulatory Visit (INDEPENDENT_AMBULATORY_CARE_PROVIDER_SITE_OTHER): Payer: BLUE CROSS/BLUE SHIELD | Admitting: Family Medicine

## 2018-10-17 ENCOUNTER — Other Ambulatory Visit: Payer: Self-pay

## 2018-10-17 VITALS — BP 117/76 | HR 83 | Temp 98.0°F | Resp 16 | Ht 66.0 in | Wt 249.0 lb

## 2018-10-17 DIAGNOSIS — L732 Hidradenitis suppurativa: Secondary | ICD-10-CM

## 2018-10-17 NOTE — Patient Instructions (Signed)
° ° ° °  If you have lab work done today you will be contacted with your lab results within the next 2 weeks.  If you have not heard from us then please contact us. The fastest way to get your results is to register for My Chart. ° ° °IF you received an x-ray today, you will receive an invoice from [facility]. Please contact [facility] at [phone] with questions or concerns regarding your invoice.  ° °IF you received labwork today, you will receive an invoice from [facility]. Please contact [facility] at [phone] with questions or concerns regarding your invoice.  ° °Our billing staff will not be able to assist you with questions regarding bills from these companies. ° °You will be contacted with the lab results as soon as they are available. The fastest way to get your results is to activate your My Chart account. Instructions are located on the last page of this paperwork. If you have not heard from us regarding the results in 2 weeks, please contact this office. °  ° ° ° °

## 2018-10-17 NOTE — Progress Notes (Signed)
Established Patient Office Visit  Subjective:  Patient ID: Melanie Townsend, female    DOB: [birth year]  Age: 28 y.o. MRN: [id number]  CC:  Chief Complaint  Patient presents with  . FMLA    pt need to follow-up with FMLA paper work ( PMDD)    HPI Melanie Townsend presents for   Patient is here for [employer] paperwork  She denies fevers or chills She has no flares today She gets breakouts in the axillae and also on her bottom She states that she would like her FMLA completed  She has a referral for dermatology She uses dove white bar soap and dial antibacterial soap She does not shave or wax She states that she will be able to get around and knows how to help the skin come to a head but sometimes she has to get it drained and it is very painful.   Wt Readings from Last 3 Encounters:  10/17/18 249 lb (112.9 kg)  08/22/18 246 lb 11.2 oz (111.9 kg)  07/22/18 240 lb (108.9 kg)   Lab Results  Component Value Date   HGBA1C 5.6 09/13/2017    Past Medical History:  Diagnosis Date  . Headache associated with hormonal factors   . Hidradenitis suppurativa of right axilla 04/2018  . PMDD (premenstrual dysphoric disorder)     Past Surgical History:  Procedure Laterality Date  . HYDRADENITIS EXCISION Left 02/26/2018   Procedure: EXCISION HIDRADENITIS TO THE LEFT AXILLA;  Surgeon: Louisa Second, MD;  Location: [facility];  Service: Plastics;  Laterality: Left;  . HYDRADENITIS EXCISION Right 04/30/2018   Procedure: EXCISION HIDRADENITIS RIGHT AXILLA;  Surgeon: Louisa Second, MD;  Location: [facility];  Service: Plastics;  Laterality: Right;  . PILONIDAL CYST EXCISION      Family History  Problem Relation Age of Onset  . Crohn's disease Father   . Diabetes Maternal Uncle   . Diabetes Maternal Grandmother   . Diabetes Paternal Grandmother     Social History   Socioeconomic History  . Marital status: Single    Spouse name: Not on  file  . Number of children: Not on file  . Years of education: Not on file  . Highest education level: Not on file  Occupational History  . Not on file  Social Needs  . Financial resource strain: Not on file  . Food insecurity:    Worry: Not on file    Inability: Not on file  . Transportation needs:    Medical: Not on file    Non-medical: Not on file  Tobacco Use  . Smoking status: Current Some Day Smoker    Packs/day: 0.33    Years: 5.00    Pack years: 1.65    Types: Cigarettes  . Smokeless tobacco: Never Used  Substance and Sexual Activity  . Alcohol use: Yes    Comment: occasionally  . Drug use: No  . Sexual activity: Not on file  Lifestyle  . Physical activity:    Days per week: Not on file    Minutes per session: Not on file  . Stress: Not on file  Relationships  . Social connections:    Talks on phone: Not on file    Gets together: Not on file    Attends religious service: Not on file    Active member of club or organization: Not on file    Attends meetings of clubs or organizations: Not on file    Relationship status: Not on  file  . Intimate partner violence:    Fear of current or ex partner: Not on file    Emotionally abused: Not on file    Physically abused: Not on file    Forced sexual activity: Not on file  Other Topics Concern  . Not on file  Social History Narrative  . Not on file    Outpatient Medications Prior to Visit  Medication Sig Dispense Refill  . acetaminophen (TYLENOL) 500 MG tablet Take 1,000 mg by mouth every 6 (six) hours as needed (pain).    . cyclobenzaprine (FLEXERIL) 10 MG tablet Take 10 mg by mouth at bedtime as needed for muscle spasms (sleep).    . docusate sodium (COLACE) 100 MG capsule Take 100 mg by mouth 2 (two) times daily as needed for mild constipation.    Marland Kitchen ibuprofen (ADVIL,MOTRIN) 800 MG tablet Take 800 mg by mouth every 8 (eight) hours as needed (pain).    . polyethylene glycol (MIRALAX / GLYCOLAX) packet Take 17 g by  mouth daily as needed (constipation). Mix in 8 oz liquid and drink    . PRESCRIPTION MEDICATION Take 600 mg by mouth See admin instructions. Boric Acid 600 mg capsules - insert one capsule (600 mg) vaginally daily at bedtime for 3 days following end of menstrual cycle    . HYDROcodone-acetaminophen (NORCO/VICODIN) 5-325 MG tablet Take 1 tablet by mouth every 6 (six) hours as needed for moderate pain.    . hydrocortisone (ANUSOL-HC) 25 MG suppository Place 1 suppository (25 mg total) rectally 2 (two) times daily. 12 suppository 0  . traMADol (ULTRAM) 50 MG tablet Take 50 mg by mouth every 6 (six) hours as needed (pain).     No facility-administered medications prior to visit.     Allergies  Allergen Reactions  . Iodine Hives       . Shellfish Allergy Hives    ROS Review of Systems See hpi    Objective:    Physical Exam  BP 117/76   Pulse 83   Temp 98 F (36.7 C) (Oral)   Resp 16   Ht 5\' 6"  (1.676 m)   Wt 249 lb (112.9 kg)   SpO2 97%   BMI 40.19 kg/m  Wt Readings from Last 3 Encounters:  10/17/18 249 lb (112.9 kg)  08/22/18 246 lb 11.2 oz (111.9 kg)  07/22/18 240 lb (108.9 kg)   Physical Exam  Constitutional: Oriented to person, place, and time. Appears well-developed and well-nourished.  HENT:  Head: Normocephalic and atraumatic.  Eyes: Conjunctivae and EOM are normal.  Cardiovascular: Normal rate, regular rhythm, normal heart sounds and intact distal pulses.  No murmur heard. Pulmonary/Chest: Effort normal and breath sounds normal. No stridor. No respiratory distress. Has no wheezes.  Neurological: Is alert and oriented to person, place, and time.  Skin: Skin is warm. Capillary refill takes less than 2 seconds.  Axillae with incisional scars bilaterally, no abscess  Psychiatric: Has a normal mood and affect. Behavior is normal. Judgment and thought content normal.    There are no preventive care reminders to display for this patient.  There are no preventive  care reminders to display for this patient.  Lab Results  Component Value Date   TSH 0.89 09/13/2017   Lab Results  Component Value Date   WBC 27.0 (H) 07/22/2018   HGB 11.2 (L) 07/22/2018   HCT 37.1 07/22/2018   MCV 87.9 07/22/2018   PLT 440 (H) 07/22/2018   Lab Results  Component Value Date  NA 135 07/22/2018   K 3.7 07/22/2018   CO2 21 (L) 07/22/2018   GLUCOSE 110 (H) 07/22/2018   BUN 7 07/22/2018   CREATININE 0.78 07/22/2018   BILITOT 0.6 07/22/2018   ALKPHOS 65 07/22/2018   AST 22 07/22/2018   ALT 27 07/22/2018   PROT 7.9 07/22/2018   ALBUMIN 3.3 (L) 07/22/2018   CALCIUM 9.1 07/22/2018   ANIONGAP 12 07/22/2018   GFR 154.22 09/13/2017   Lab Results  Component Value Date   CHOL 135 09/13/2017   Lab Results  Component Value Date   HDL 36.30 (L) 09/13/2017   Lab Results  Component Value Date   LDLCALC 84 09/13/2017   Lab Results  Component Value Date   TRIG 73.0 09/13/2017   Lab Results  Component Value Date   CHOLHDL 4 09/13/2017   Lab Results  Component Value Date   HGBA1C 5.6 09/13/2017      Assessment & Plan:   Problem List Items Addressed This Visit      Musculoskeletal and Integument   Hidradenitis axillaris - Primary     Patient with a history of chronic hydradenitis suppurative who presents for renewal of her FMLA  She has had one episode already this year for a week and resolved with conservative mgmt with hot compresses and skin care She is concerned about the time off from work and jeopordizing her job  In 2019 she had numerous episodes and had to get home health and wound care This year she is concern about the consequences  Based on the history we will expect that her flares will last about 5 days  She states that has not gotten in with Dermatology due to long wait times She is currently following the recommendations for skin care    No orders of the defined types were placed in this encounter.   Follow-up: No follow-ups  on file.    Doristine Bosworth, MD

## 2018-10-18 ENCOUNTER — Telehealth: Payer: Self-pay | Admitting: Family Medicine

## 2018-10-18 DIAGNOSIS — N898 Other specified noninflammatory disorders of vagina: Secondary | ICD-10-CM | POA: Diagnosis not present

## 2018-10-18 DIAGNOSIS — Z113 Encounter for screening for infections with a predominantly sexual mode of transmission: Secondary | ICD-10-CM | POA: Diagnosis not present

## 2018-10-18 NOTE — Telephone Encounter (Signed)
Patient dropped off the [facility] paperwork.  Copied from CRM [id number]. Topic: General - Other >> Oct 18, 2018  2:50 PM Jaquita Rector A wrote: Reason for CRM: Patient called to check if FMLA paper work was received from her employer but hung up before I was able to get her an answer.

## 2018-10-22 NOTE — Telephone Encounter (Signed)
Will follow-up with paper work tomorrow when Dr. Noberto Retort in in the office.

## 2018-10-23 DIAGNOSIS — Z23 Encounter for immunization: Secondary | ICD-10-CM | POA: Diagnosis not present

## 2018-10-23 DIAGNOSIS — L732 Hidradenitis suppurativa: Secondary | ICD-10-CM | POA: Diagnosis not present

## 2018-10-23 DIAGNOSIS — L700 Acne vulgaris: Secondary | ICD-10-CM | POA: Diagnosis not present

## 2018-10-23 DIAGNOSIS — L219 Seborrheic dermatitis, unspecified: Secondary | ICD-10-CM | POA: Diagnosis not present

## 2018-10-23 NOTE — Telephone Encounter (Signed)
Called pt to advise FMLA forms completed and I can fax to [facility] and she can p/u original.  Pt is concerned about getting appropriate coverage for her flare up as she has had more over the past 6 months.  Per pt dates and ? Flares per month must be precise as possible as she has new management and she wanting to make sure she is adequately covered so she doesn't lose her job.  I advised I would speak with dr Creta Levin re: her concerns before faxing and contact her back on tomorrow pt agreeable. Dgaddy, CMA

## 2018-10-23 NOTE — Telephone Encounter (Signed)
Left message on voicemail to return office call. Dgaddy, CMA 

## 2018-10-24 ENCOUNTER — Telehealth: Payer: Self-pay

## 2018-10-24 DIAGNOSIS — L732 Hidradenitis suppurativa: Secondary | ICD-10-CM | POA: Diagnosis not present

## 2018-10-24 NOTE — Telephone Encounter (Signed)
Spoke with pt advised adjustments made and form ready to be faxed.  Form faxed and copy made for scanning and for me to keep until scanned copy shows up in chart.  Pt agreeable. Dgaddy, CMA

## 2018-10-24 NOTE — Telephone Encounter (Signed)
Left message on voicemail for pt to return my call re: fmla we spoke about on yesterday. Dgaddy, CMA

## 2018-10-29 ENCOUNTER — Telehealth: Payer: Self-pay

## 2018-10-29 NOTE — Telephone Encounter (Signed)
Pt in office today to p/u ([employer])disability forms, forms copied and given to pt.  Original forms sent to be scanned.  I have a copy in my folder if another copy is needed in the future if not scanned in chart as of yet. Dgaddy, CMA

## 2018-11-01 ENCOUNTER — Ambulatory Visit: Payer: BLUE CROSS/BLUE SHIELD | Admitting: Family Medicine

## 2018-11-04 ENCOUNTER — Other Ambulatory Visit: Payer: Self-pay

## 2018-11-04 ENCOUNTER — Encounter (HOSPITAL_COMMUNITY): Payer: Self-pay

## 2018-11-04 ENCOUNTER — Emergency Department (HOSPITAL_COMMUNITY)
Admission: EM | Admit: 2018-11-04 | Discharge: 2018-11-04 | Disposition: A | Discharge status: Home / Self Care | Payer: BLUE CROSS/BLUE SHIELD | Attending: Emergency Medicine | Admitting: Emergency Medicine

## 2018-11-04 DIAGNOSIS — R509 Fever, unspecified: Secondary | ICD-10-CM | POA: Diagnosis not present

## 2018-11-04 DIAGNOSIS — F17210 Nicotine dependence, cigarettes, uncomplicated: Secondary | ICD-10-CM | POA: Insufficient documentation

## 2018-11-04 DIAGNOSIS — J029 Acute pharyngitis, unspecified: Secondary | ICD-10-CM | POA: Insufficient documentation

## 2018-11-04 LAB — GROUP A STREP BY PCR: Group A Strep by PCR: NOT DETECTED

## 2018-11-04 MED ORDER — ACETAMINOPHEN 325 MG PO TABS
650.0000 mg | ORAL_TABLET | Freq: Once | ORAL | Status: AC
  Administered 2018-11-04: 650 mg via ORAL
  Filled 2018-11-04: qty 2

## 2018-11-04 MED ORDER — NAPROXEN 500 MG PO TABS: 500.0000 mg | ORAL_TABLET | Freq: Two times a day (BID) | ORAL | 0 refills | Status: DC

## 2018-11-04 MED ORDER — DEXAMETHASONE SODIUM PHOSPHATE 10 MG/ML IJ SOLN
10.0000 mg | Freq: Once | INTRAMUSCULAR | Status: AC
  Administered 2018-11-04: 10 mg via INTRAMUSCULAR
  Filled 2018-11-04: qty 1

## 2018-11-04 NOTE — ED Triage Notes (Signed)
Onset today sore throat, subjective fever.  No one in household sick.

## 2018-11-04 NOTE — ED Provider Notes (Signed)
[facility] EMERGENCY DEPARTMENT Provider Note   CSN: [id number] Arrival date & time: 11/04/18  1938    History   Chief Complaint Chief Complaint  Patient presents with  . Sore Throat    HPI Melanie Townsend is a 28 y.o. female with a hx of tobacco abuse, obesity, and hidradenitis who presents to the ED with complaints of sore throat that began this AM. Pain is constant, worse with swallowing but able to swallow, no alleviating factors. No intervention PTA. Subjective associated fever. Denies chills, congestion, cough, dyspnea, drooling, voice change, or emesis. No travel.      HPI  Past Medical History:  Diagnosis Date  . Headache associated with hormonal factors   . Hidradenitis suppurativa of right axilla 04/2018  . PMDD (premenstrual dysphoric disorder)     Patient Active Problem List   Diagnosis Date Noted  . Perirectal abscess 07/16/2018  . Rectal abscess 07/16/2018  . Class 2 obesity due to excess calories without serious comorbidity with body mass index (BMI) of 38.0 to 38.9 in adult 11/10/2017  . Hidradenitis axillaris 09/25/2017    Past Surgical History:  Procedure Laterality Date  . HYDRADENITIS EXCISION Left 02/26/2018   Procedure: EXCISION HIDRADENITIS TO THE LEFT AXILLA;  Surgeon: Louisa Second, MD;  Location: [facility];  Service: Plastics;  Laterality: Left;  . HYDRADENITIS EXCISION Right 04/30/2018   Procedure: EXCISION HIDRADENITIS RIGHT AXILLA;  Surgeon: Louisa Second, MD;  Location: [facility];  Service: Plastics;  Laterality: Right;  . PILONIDAL CYST EXCISION       OB History   No obstetric history on file.      Home Medications    Prior to Admission medications   Medication Sig Start Date End Date Taking? Authorizing Provider  acetaminophen (TYLENOL) 500 MG tablet Take 1,000 mg by mouth every 6 (six) hours as needed (pain).    [provider]  cyclobenzaprine (FLEXERIL)  10 MG tablet Take 10 mg by mouth at bedtime as needed for muscle spasms (sleep).    [provider]  docusate sodium (COLACE) 100 MG capsule Take 100 mg by mouth 2 (two) times daily as needed for mild constipation.    [provider]  ibuprofen (ADVIL,MOTRIN) 800 MG tablet Take 800 mg by mouth every 8 (eight) hours as needed (pain).    [provider]  polyethylene glycol (MIRALAX / GLYCOLAX) packet Take 17 g by mouth daily as needed (constipation). Mix in 8 oz liquid and drink    [provider]  PRESCRIPTION MEDICATION Take 600 mg by mouth See admin instructions. Boric Acid 600 mg capsules - insert one capsule (600 mg) vaginally daily at bedtime for 3 days following end of menstrual cycle    [provider]    Family History Family History  Problem Relation Age of Onset  . Crohn's disease Father   . Diabetes Maternal Uncle   . Diabetes Maternal Grandmother   . Diabetes Paternal Grandmother     Social History Social History   Tobacco Use  . Smoking status: Current Some Day Smoker    Packs/day: 0.33    Years: 5.00    Pack years: 1.65    Types: Cigarettes  . Smokeless tobacco: Never Used  Substance Use Topics  . Alcohol use: Yes    Comment: occasionally  . Drug use: No     Allergies   Iodine and Shellfish allergy   Review of Systems Review of Systems  Constitutional: Positive  for fever (subjective). Negative for chills.  HENT: Positive for sore throat and trouble swallowing (painful but able). Negative for congestion, ear pain and voice change.   Respiratory: Negative for cough and shortness of breath.   Cardiovascular: Negative for chest pain.  Gastrointestinal: Negative for vomiting.     Physical Exam Updated Vital Signs BP 132/87   Pulse 90   Temp 99.4 F (37.4 C) (Oral)   Resp 16   LMP 10/22/2018   SpO2 99%   Physical Exam Vitals signs and nursing note reviewed.  Constitutional:      General: She is not in  acute distress.    Appearance: She is well-developed.  HENT:     Head: Normocephalic and atraumatic.     Right Ear: Ear canal normal. Tympanic membrane is not perforated, erythematous, retracted or bulging.     Left Ear: Ear canal normal. Tympanic membrane is not perforated, erythematous, retracted or bulging.     Ears:     Comments: No mastoid erythema/swelling/tenderness.     Nose: Nose normal.     Right Sinus: No maxillary sinus tenderness or frontal sinus tenderness.     Left Sinus: No maxillary sinus tenderness or frontal sinus tenderness.     Mouth/Throat:     Pharynx: Uvula midline. Posterior oropharyngeal erythema present. No oropharyngeal exudate.     Tonsils: Tonsillar exudate present. 2+ on the right. 2+ on the left.     Comments: Posterior oropharynx is symmetric appearing. Patient tolerating own secretions without difficulty. No trismus. No drooling. No hot potato voice. No swelling beneath the tongue, submandibular compartment is soft.  Eyes:     General:        Right eye: No discharge.        Left eye: No discharge.     Conjunctiva/sclera: Conjunctivae normal.     Pupils: Pupils are equal, round, and reactive to light.  Neck:     Musculoskeletal: Normal range of motion and neck supple. No edema or neck rigidity.  Cardiovascular:     Rate and Rhythm: Normal rate and regular rhythm.     Heart sounds: No murmur.  Pulmonary:     Effort: Pulmonary effort is normal. No respiratory distress.     Breath sounds: Normal breath sounds. No wheezing, rhonchi or rales.  Abdominal:     General: There is no distension.     Palpations: Abdomen is soft.     Tenderness: There is no abdominal tenderness.     Comments: No splenomegaly.   Lymphadenopathy:     Cervical: No cervical adenopathy.  Skin:    General: Skin is warm and dry.     Findings: No rash.  Neurological:     Mental Status: She is alert.  Psychiatric:        Behavior: Behavior normal.      ED Treatments /  Results  Labs (all labs ordered are listed, but only abnormal results are displayed) Labs Reviewed  GROUP A STREP BY PCR    EKG None  Radiology No results found.  Procedures Procedures (including critical care time)  Medications Ordered in ED Medications  dexamethasone (DECADRON) injection 10 mg (has no administration in time range)  acetaminophen (TYLENOL) tablet 650 mg (650 mg Oral Given 11/04/18 2008)     Initial Impression / Assessment and Plan / ED Course  I have reviewed the triage vital signs and the nursing notes.  Pertinent labs & imaging results that were available during my care of the  patient were reviewed by me and considered in my medical decision making (see chart for details).  Patient presents with sore throat. Patient is nontoxic appearing in no apparent distress, vital signs WNL. On exam has posterior oropharyngeal erythema w/ exudates & 2+ symmetric tonsils. Presentation non concerning for PTA or RPA. No trismus, uvula deviation, or hot potato voice. Patient has full painless ROM of the neck. Strep negative. No indication for abx at this time. If patient does not have improvement in next 1 week would consider mono, no splenomegaly on exam.  Will treat with decadron in the ED at patient's request. Discharge home with symptomatic tx for pain  Pt does not appear dehydrated, but did discuss importance of water rehydration. I discussed results, treatment plan, need for PCP follow-up, and return precautions with the patient. Provided opportunity for questions, patient confirmed understanding and is in agreement with plan.     Final Clinical Impressions(s) / ED Diagnoses   Final diagnoses:  Pharyngitis, unspecified etiology    ED Discharge Orders         Ordered    naproxen (NAPROSYN) 500 MG tablet  2 times daily     11/04/18 2058           Cherly Anderson, PA-C 11/04/18 2059    Terrilee Files, MD 11/05/18 [phone]

## 2018-11-04 NOTE — Discharge Instructions (Addendum)
You were seen in the ER today for a sore throat.  Your strep test was negative.  We are given a shot of Decadron, steroid, to help with your symptoms.  We are sending her with naproxen to help with pain.  - Naproxen is a nonsteroidal anti-inflammatory medication that will help with pain and swelling. Be sure to take this medication as prescribed with food, 1 pill every 12 hours,  It should be taken with food, as it can cause stomach upset, and more seriously, stomach bleeding. Do not take other nonsteroidal anti-inflammatory medications with this such as Advil, Motrin, Aleve, Mobic, Goodie Powder, or Motrin.    You make take Tylenol per over the counter dosing with these medications.   We have prescribed you new medication(s) today. Discuss the medications prescribed today with your pharmacist as they can have adverse effects and interactions with your other medicines including over the counter and prescribed medications. Seek medical evaluation if you start to experience new or abnormal symptoms after taking one of these medicines, seek care immediately if you start to experience difficulty breathing, feeling of your throat closing, facial swelling, or rash as these could be indications of a more serious allergic reaction    At this time we suspect your symptoms are related to a virus.  Follow-up with primary care within 5 days for reevaluation.  Return to ER for new or worsening symptoms including but not limited to worsening pain, inability to swallow, trouble moving your neck, trouble opening your mouth, drooling, change in voice, or any other concerns.

## 2018-11-06 ENCOUNTER — Other Ambulatory Visit: Payer: Self-pay

## 2018-11-06 ENCOUNTER — Ambulatory Visit (INDEPENDENT_AMBULATORY_CARE_PROVIDER_SITE_OTHER): Payer: BLUE CROSS/BLUE SHIELD | Admitting: Family Medicine

## 2018-11-06 ENCOUNTER — Encounter: Payer: Self-pay | Admitting: Family Medicine

## 2018-11-06 VITALS — BP 119/82 | HR 79 | Temp 98.8°F | Resp 16 | Ht 66.0 in | Wt 245.2 lb

## 2018-11-06 DIAGNOSIS — R0683 Snoring: Secondary | ICD-10-CM | POA: Diagnosis not present

## 2018-11-06 DIAGNOSIS — J353 Hypertrophy of tonsils with hypertrophy of adenoids: Secondary | ICD-10-CM

## 2018-11-06 DIAGNOSIS — J029 Acute pharyngitis, unspecified: Secondary | ICD-10-CM | POA: Diagnosis not present

## 2018-11-06 MED ORDER — ZOLPIDEM TARTRATE 5 MG PO TABS: 5.0000 mg | ORAL_TABLET | Freq: Every evening | ORAL | 1 refills | Status: DC | PRN

## 2018-11-06 MED ORDER — AMOXICILLIN 250 MG/5ML PO SUSR: 500.0000 mg | Freq: Two times a day (BID) | ORAL | 0 refills | Status: DC

## 2018-11-06 MED ORDER — CEFTRIAXONE SODIUM 250 MG IJ SOLR
500.0000 mg | Freq: Once | INTRAMUSCULAR | Status: AC
  Administered 2018-11-06: 500 mg via INTRAMUSCULAR

## 2018-11-06 MED ORDER — AMOXICILLIN-POT CLAVULANATE 875-125 MG PO TABS: 1.0000 | ORAL_TABLET | Freq: Two times a day (BID) | ORAL | 0 refills | Status: AC

## 2018-11-06 NOTE — Progress Notes (Signed)
Established Patient Office Visit  Subjective:  Patient ID: Melanie Townsend, female    DOB: 1991/05/07  Age: 28 y.o. MRN: [id number]  CC:  Chief Complaint  Patient presents with  . Sore Throat    was seen in hospital and wants to follow up on her sore throat  . Referral    wants a referral to ENT    HPI Machala Lockwood presents for    Patient reports that she was seen in the ER for pharyngitis She states that she also has fever She reports that she is able to speak and swallow and denies gagging and choking She receive decadron in the ER on 11/04/2018 Strep was negative by PCR  She reports that her dentist told her that she should get a sleep apnea test She snores at night    Past Medical History:  Diagnosis Date  . Headache associated with hormonal factors   . Hidradenitis suppurativa of right axilla 04/2018  . PMDD (premenstrual dysphoric disorder)     Past Surgical History:  Procedure Laterality Date  . HYDRADENITIS EXCISION Left 02/26/2018   Procedure: EXCISION HIDRADENITIS TO THE LEFT AXILLA;  Surgeon: Louisa Second, MD;  Location: [facility];  Service: Plastics;  Laterality: Left;  . HYDRADENITIS EXCISION Right 04/30/2018   Procedure: EXCISION HIDRADENITIS RIGHT AXILLA;  Surgeon: Louisa Second, MD;  Location:  [facility];  Service: Plastics;  Laterality: Right;  . PILONIDAL CYST EXCISION      Family History  Problem Relation Age of Onset  . Crohn's disease Father   . Diabetes Maternal Uncle   . Diabetes Maternal Grandmother   . Diabetes Paternal Grandmother     Social History   Socioeconomic History  . Marital status: Single    Spouse name: Not on file  . Number of children: Not on file  . Years of education: Not on file  . Highest education level: Not on file  Occupational History  . Not on file  Social Needs  . Financial resource strain: Not on file  . Food insecurity:    Worry: Not on file   Inability: Not on file  . Transportation needs:    Medical: Not on file    Non-medical: Not on file  Tobacco Use  . Smoking status: Current Some Day Smoker    Packs/day: 0.33    Years: 5.00    Pack years: 1.65    Types: Cigarettes  . Smokeless tobacco: Never Used  Substance and Sexual Activity  . Alcohol use: Yes    Comment: occasionally  . Drug use: No  . Sexual activity: Not on file  Lifestyle  . Physical activity:    Days per week: Not on file    Minutes per session: Not on file  . Stress: Not on file  Relationships  . Social connections:    Talks on phone: Not on file    Gets together: Not on file    Attends religious service: Not on file    Active member of club or organization: Not on file    Attends meetings of clubs or organizations: Not on file    Relationship status: Not on file  . Intimate partner violence:    Fear of current or ex partner: Not on file    Emotionally abused: Not on file    Physically abused: Not on file    Forced sexual activity: Not on file  Other Topics Concern  . Not on file  Social History  Narrative  . Not on file    Outpatient Medications Prior to Visit  Medication Sig Dispense Refill  . naproxen (NAPROSYN) 500 MG tablet Take 1 tablet (500 mg total) by mouth 2 (two) times daily. 10 tablet 0  . PRESCRIPTION MEDICATION Take 600 mg by mouth See admin instructions. Boric Acid 600 mg capsules - insert one capsule (600 mg) vaginally daily at bedtime for 3 days following end of menstrual cycle    . acetaminophen (TYLENOL) 500 MG tablet Take 1,000 mg by mouth every 6 (six) hours as needed (pain).    . cyclobenzaprine (FLEXERIL) 10 MG tablet Take 10 mg by mouth at bedtime as needed for muscle spasms (sleep).    . docusate sodium (COLACE) 100 MG capsule Take 100 mg by mouth 2 (two) times daily as needed for mild constipation.    Marland Kitchen ibuprofen (ADVIL,MOTRIN) 800 MG tablet Take 800 mg by mouth every 8 (eight) hours as needed (pain).    . polyethylene  glycol (MIRALAX / GLYCOLAX) packet Take 17 g by mouth daily as needed (constipation). Mix in 8 oz liquid and drink     No facility-administered medications prior to visit.     Allergies  Allergen Reactions  . Iodine Hives       . Shellfish Allergy Hives    ROS Review of Systems  Review of Systems  Constitutional: Negative for activity change, appetite change, chills and fever.  HENT: SEE HPI Respiratory: Negative for cough, shortness of breath and wheezing.   Gastrointestinal: Negative for diarrhea, nausea and vomiting.  Genitourinary: Negative for difficulty urinating, dysuria, flank pain and hematuria.  Musculoskeletal: Negative for back pain, joint swelling and neck pain.  Neurological: Negative for dizziness, speech difficulty, light-headedness and numbness.  See HPI. All other review of systems negative.     Objective:    Physical Exam  BP 119/82   Pulse 79   Temp 98.8 F (37.1 C) (Oral)   Resp 16   Ht 5\' 6"  (1.676 m)   Wt 245 lb 3.2 oz (111.2 kg)   LMP 10/22/2018   SpO2 98%   BMI 39.58 kg/m  Wt Readings from Last 3 Encounters:  11/06/18 245 lb 3.2 oz (111.2 kg)  10/17/18 249 lb (112.9 kg)  08/22/18 246 lb 11.2 oz (111.9 kg)    General: alert, oriented, in NAD Head: normocephalic, atraumatic, no sinus tenderness Eyes: EOM intact, no scleral icterus or conjunctival injection Ears: TM clear bilaterally Nose: mucosa nonerythematous, nonedematous Throat: copious pharyngeal exudate noted but not touching without signs of airway compromise, no drooling Lymph: no posterior auricular, submental or cervical lymph adenopathy Heart: normal rate, normal sinus rhythm, no murmurs Lungs: clear to auscultation bilaterally, no wheezing    Health Maintenance Due  Topic Date Due  . TETANUS/TDAP  02/13/2012    There are no preventive care reminders to display for this patient.  Lab Results  Component Value Date   TSH 0.89 09/13/2017   Lab Results  Component  Value Date   WBC 27.0 (H) 07/22/2018   HGB 11.2 (L) 07/22/2018   HCT 37.1 07/22/2018   MCV 87.9 07/22/2018   PLT 440 (H) 07/22/2018   Lab Results  Component Value Date   NA 135 07/22/2018   K 3.7 07/22/2018   CO2 21 (L) 07/22/2018   GLUCOSE 110 (H) 07/22/2018   BUN 7 07/22/2018   CREATININE 0.78 07/22/2018   BILITOT 0.6 07/22/2018   ALKPHOS 65 07/22/2018   AST 22 07/22/2018  ALT 27 07/22/2018   PROT 7.9 07/22/2018   ALBUMIN 3.3 (L) 07/22/2018   CALCIUM 9.1 07/22/2018   ANIONGAP 12 07/22/2018   GFR 154.22 09/13/2017   Lab Results  Component Value Date   CHOL 135 09/13/2017   Lab Results  Component Value Date   HDL 36.30 (L) 09/13/2017   Lab Results  Component Value Date   LDLCALC 84 09/13/2017   Lab Results  Component Value Date   TRIG 73.0 09/13/2017   Lab Results  Component Value Date   CHOLHDL 4 09/13/2017   Lab Results  Component Value Date   HGBA1C 5.6 09/13/2017      Assessment & Plan:   Problem List Items Addressed This Visit    None    Visit Diagnoses    Enlarged tonsils and adenoids    -  Primary  Will treat empirically with rocephin and send home with augmentin Pt at risk for peritonsillar abscess since it is progressing thus will cover and refer for ENT evaluation    Relevant Medications   cefTRIAXone (ROCEPHIN) injection 500 mg (Start on 11/06/2018  3:45 PM)   Other Relevant Orders   Ambulatory referral to ENT   Sore throat    -  Negative for strep based on PCR in the ER   Snoring    - discussed that she should get evaluation by ENT as the adenoids can cause gland involvement       Meds ordered this encounter  Medications  . cefTRIAXone (ROCEPHIN) injection 500 mg  . DISCONTD: amoxicillin (AMOXIL) 250 MG/5ML suspension    Sig: Take 10 mLs (500 mg total) by mouth 2 (two) times daily for 7 days.    Dispense:  140 mL    Refill:  0  . amoxicillin-clavulanate (AUGMENTIN) 875-125 MG tablet    Sig: Take 1 tablet by mouth 2 (two) times  daily for 10 days.    Dispense:  20 tablet    Refill:  0    Corrected previous amoxicillin only to augmentin for broader coverage    Follow-up: No follow-ups on file.    Doristine Bosworth, MD

## 2018-11-06 NOTE — Patient Instructions (Addendum)
Will send referral for ENT for your throat   For your tonsillitis  Take amoxicillin one tablet twice a day for 10 days    If you have lab work done today you will be contacted with your lab results within the next 2 weeks.  If you have not heard from [place] then please contact us. The fastest way to get your results is to register for My Chart.   IF you received an x-ray today, you will receive an invoice from [facility]. Please contact [facility] at [phone] with questions or concerns regarding your invoice.   IF you received labwork today, you will receive an invoice from [facility]. Please contact [facility] at [phone] with questions or concerns regarding your invoice.   Our billing staff will not be able to assist you with questions regarding bills from these companies.  You will be contacted with the lab results as soon as they are available. The fastest way to get your results is to activate your My Chart account. Instructions are located on the last page of this paperwork. If you have not heard from [place] regarding the results in 2 weeks, please contact this office.      Enlarged Adenoids  Having enlarged adenoids means that your adenoids are larger than usual. The adenoids are areas of soft tissue located high in the back of the throat, behind the nose and the roof of the mouth. They are part of the body's natural disease-fighting system (immune system). Adenoids trap germs, such as bacteria and viruses, as they pass through the throat. This can make the adenoids inflamed and enlarged. Adenoids also have immune cells that produce blood proteins (antibodies) that help destroy germs. What are the causes? This condition may be caused by:  Viral or bacterial infections.  Allergies.  Smoking or exposure to otherirritants.  Backup of stomach fluids or food into the esophagus (gastroesophageal reflux).  Cancer. This is very rare. What increases the  risk? You are more likely to develop this condition if you:  Have allergies.  Are often exposed to bacterial or viral infections.  Have gastroesophageal reflux disease (GERD).  Smoke.  Are HIV positive. What are the signs or symptoms? Symptoms of this condition may include:  Dry, cracked lips and mouth.  Restlessness while sleeping.  Snoring.  Bad breath.  Frequent ear infections.  Runny nose or nasal congestion.  Enlarged tonsils.  Difficulty hearing.  Persistent coughing.  Nasal voice.  Mouth breathing.  Fever. How is this diagnosed? This condition may be diagnosed based on your symptoms and medical history, including how many sore throats you have had over the past 1-3 years.  Your health care provider may also do a physical exam to check your ears, nose, and throat using a small mirror or a flexible scope (nasopharyngoscope).  You may also have other tests. These may include: ? An X-ray of your throat. ? A throat culture to check for a bacterial infection. ? Blood tests to check for viral infections, such as mononucleosis. ? A sleep study. How is this treated? Depending on the cause, the condition may go away in 5-7 days without treatment. If needed, this condition may be treated with:  Corticosteroid nasal spray or other allergy medicines, such as antihistamines to help reduce swelling.  Antibiotic medicines to treat a bacterial infection.  Surgery to remove the adenoids (adenoidectomy) and possibly the tonsils (tonsillectomy). This may be done if you have: ? Extreme pain. ? Breathing problems. ? Frequent infections.  Lifestyle and diet changes.  Medicine to help treat reflux. Follow these instructions at home:  Take over-the-counter and prescription medicines only as told by your health care provider.  If you were prescribed an antibiotic medicine, take it as told by your health care provider. Do not stop using the antibiotic even if you start  to feel better.  Do not use any products that contain nicotine or tobacco, such as cigarettes, e-cigarettes, and chewing tobacco. If you need help quitting, ask your health care provider.  Keep all follow-up visits as told by your health care provider. This is important. Contact a health care provider if you have:  Ear or sinus infections that keep coming back (recurring).  Hearing loss.  Trouble sleeping. Get help right away if you have:  Severe pain.  Trouble talking, breathing, or swallowing. Summary  Having enlarged adenoids means that your adenoids are larger than usual.  Depending on the cause, the condition may go away in 5-7 days without treatment. If needed, the condition may be treated with medicines, lifestyle changes, or surgery.  Take over-the-counter and prescription medicines only as told by your health care provider.  Contact a health care provider if you have recurring ear or sinus infections, hearing loss, or trouble sleeping.  Keep all follow-up visits as told by your health care provider. This is important. This information is not intended to replace advice given to you by your health care provider. Make sure you discuss any questions you have with your health care provider. Document Released: 07/15/2005 Document Revised: 02/21/2018 Document Reviewed: 02/21/2018 Elsevier Interactive Patient Education  2019 [employer].

## 2018-11-23 ENCOUNTER — Encounter: Payer: Self-pay | Admitting: Family Medicine

## 2018-11-23 ENCOUNTER — Ambulatory Visit (INDEPENDENT_AMBULATORY_CARE_PROVIDER_SITE_OTHER): Payer: BLUE CROSS/BLUE SHIELD | Admitting: Family Medicine

## 2018-11-23 ENCOUNTER — Other Ambulatory Visit: Payer: Self-pay

## 2018-11-23 ENCOUNTER — Telehealth: Payer: Self-pay | Admitting: Family Medicine

## 2018-11-23 VITALS — BP 132/88 | HR 86 | Temp 99.1°F | Resp 16 | Ht 65.0 in | Wt 244.6 lb

## 2018-11-23 DIAGNOSIS — Z23 Encounter for immunization: Secondary | ICD-10-CM | POA: Diagnosis not present

## 2018-11-23 DIAGNOSIS — J353 Hypertrophy of tonsils with hypertrophy of adenoids: Secondary | ICD-10-CM | POA: Diagnosis not present

## 2018-11-23 NOTE — Progress Notes (Signed)
Established Patient Office Visit  Subjective:  Patient ID: Melanie Townsend, female    DOB: 04/01/1991  Age: 28 y.o. MRN: [id number]  CC:  Chief Complaint  Patient presents with  . enlarge tonsils    pain when swollow. Do not have the puss anymore, just the pain that will not go away. Hx of lots of abscess different area of the body    HPI [employer] Townsend presents for   Thi is a patient with recurrent abscess presents to follow up for sore throat She reports that she has been feeling much better without fevers but had a mild temperature of 99 She has less pain with swallowing There in no mucus draining She denies ear pain or sinus pain No nausea vomiting or fevers  Past Medical History:  Diagnosis Date  . Headache associated with hormonal factors   . Hidradenitis suppurativa of right axilla 04/2018  . PMDD (premenstrual dysphoric disorder)     Past Surgical History:  Procedure Laterality Date  . HYDRADENITIS EXCISION Left 02/26/2018   Procedure: EXCISION HIDRADENITIS TO THE LEFT AXILLA;  Surgeon: Louisa Secondruesdale, Gerald, MD;  Location: [facility];  Service: Plastics;  Laterality: Left;  . HYDRADENITIS EXCISION Right 04/30/2018   Procedure: EXCISION HIDRADENITIS RIGHT AXILLA;  Surgeon: Louisa Secondruesdale, Gerald, MD;  Location: [facility];  Service: Plastics;  Laterality: Right;  . PILONIDAL CYST EXCISION      Family History  Problem Relation Age of Onset  . Crohn's disease Father   . Diabetes Maternal Uncle   . Diabetes Maternal Grandmother   . Diabetes Paternal Grandmother     Social History   Socioeconomic History  . Marital status: Single    Spouse name: Not on file  . Number of children: Not on file  . Years of education: Not on file  . Highest education level: Not on file  Occupational History  . Not on file  Social Needs  . Financial resource strain: Not on file  . Food insecurity:    Worry: Not on file    Inability: Not on  file  . Transportation needs:    Medical: Not on file    Non-medical: Not on file  Tobacco Use  . Smoking status: Current Some Day Smoker    Packs/day: 0.33    Years: 5.00    Pack years: 1.65    Types: Cigarettes  . Smokeless tobacco: Never Used  Substance and Sexual Activity  . Alcohol use: Yes    Comment: occasionally  . Drug use: No  . Sexual activity: Not on file  Lifestyle  . Physical activity:    Days per week: Not on file    Minutes per session: Not on file  . Stress: Not on file  Relationships  . Social connections:    Talks on phone: Not on file    Gets together: Not on file    Attends religious service: Not on file    Active member of club or organization: Not on file    Attends meetings of clubs or organizations: Not on file    Relationship status: Not on file  . Intimate partner violence:    Fear of current or ex partner: Not on file    Emotionally abused: Not on file    Physically abused: Not on file    Forced sexual activity: Not on file  Other Topics Concern  . Not on file  Social History Narrative  . Not on file  Outpatient Medications Prior to Visit  Medication Sig Dispense Refill  . naproxen (NAPROSYN) 500 MG tablet Take 1 tablet (500 mg total) by mouth 2 (two) times daily. 10 tablet 0  . PRESCRIPTION MEDICATION Take 600 mg by mouth See admin instructions. Boric Acid 600 mg capsules - insert one capsule (600 mg) vaginally daily at bedtime for 3 days following end of menstrual cycle    . zolpidem (AMBIEN) 5 MG tablet Take 1 tablet (5 mg total) by mouth at bedtime as needed for sleep. 15 tablet 1   No facility-administered medications prior to visit.     Allergies  Allergen Reactions  . Iodine Hives       . Shellfish Allergy Hives    ROS Review of Systems    Objective:    Physical Exam  BP 132/88 (BP Location: Right Arm, Patient Position: Sitting, Cuff Size: Large)   Pulse 86   Temp 99.1 F (37.3 C) (Oral)   Resp 16   Ht   (1.651 m)   Wt 244 lb 9.6 oz (110.9 kg)   LMP 11/22/2018   SpO2 99%   BMI 40.70 kg/m  Wt Readings from Last 3 Encounters:  11/23/18 244 lb 9.6 oz (110.9 kg)  11/06/18 245 lb 3.2 oz (111.2 kg)  10/17/18 249 lb (112.9 kg)   General: alert, oriented, in NAD Head: normocephalic, atraumatic, no sinus tenderness Eyes: EOM intact, no scleral icterus or conjunctival injection Ears: TM clear bilaterally Nose: mucosa nonerythematous, nonedematous Throat: no pharyngeal exudate or erythema, tonsills hypertrophied but not meeting in the midline Lymph: no posterior auricular, submental or cervical lymph adenopathy Lungs: normal effort, no wheezing    Health Maintenance Due  Topic Date Due  . TETANUS/TDAP  02/13/2012    There are no preventive care reminders to display for this patient.  Lab Results  Component Value Date   TSH 0.89 09/13/2017   Lab Results  Component Value Date   WBC 27.0 (H) 07/22/2018   HGB 11.2 (L) 07/22/2018   HCT 37.1 07/22/2018   MCV 87.9 07/22/2018   PLT 440 (H) 07/22/2018   Lab Results  Component Value Date   NA 135 07/22/2018   K 3.7 07/22/2018   CO2 21 (L) 07/22/2018   GLUCOSE 110 (H) 07/22/2018   BUN 7 07/22/2018   CREATININE 0.78 07/22/2018   BILITOT 0.6 07/22/2018   ALKPHOS 65 07/22/2018   AST 22 07/22/2018   ALT 27 07/22/2018   PROT 7.9 07/22/2018   ALBUMIN 3.3 (L) 07/22/2018   CALCIUM 9.1 07/22/2018   ANIONGAP 12 07/22/2018   GFR 154.22 09/13/2017   Lab Results  Component Value Date   CHOL 135 09/13/2017   Lab Results  Component Value Date   HDL 36.30 (L) 09/13/2017   Lab Results  Component Value Date   LDLCALC 84 09/13/2017   Lab Results  Component Value Date   TRIG 73.0 09/13/2017   Lab Results  Component Value Date   CHOLHDL 4 09/13/2017   Lab Results  Component Value Date   HGBA1C 5.6 09/13/2017      Assessment & Plan:   Problem List Items Addressed This Visit    None    Visit Diagnoses    Enlarged tonsils  and adenoids    -  Primary   Relevant Orders   Ambulatory referral to ENT   Need for prophylactic vaccination with combined diphtheria-tetanus-pertussis (DTP) vaccine          No orders of the  defined types were placed in this encounter.   Follow-up: No follow-ups on file.    Doristine Bosworth, MD

## 2018-11-23 NOTE — Patient Instructions (Signed)
° ° ° °  If you have lab work done today you will be contacted with your lab results within the next 2 weeks.  If you have not heard from us then please contact us. The fastest way to get your results is to register for My Chart. ° ° °IF you received an x-ray today, you will receive an invoice from [facility]. Please contact [facility] at [phone] with questions or concerns regarding your invoice.  ° °IF you received labwork today, you will receive an invoice from [facility]. Please contact [facility] at [phone] with questions or concerns regarding your invoice.  ° °Our billing staff will not be able to assist you with questions regarding bills from these companies. ° °You will be contacted with the lab results as soon as they are available. The fastest way to get your results is to activate your My Chart account. Instructions are located on the last page of this paperwork. If you have not heard from us regarding the results in 2 weeks, please contact this office. °  ° ° ° °

## 2018-11-25 ENCOUNTER — Ambulatory Visit (HOSPITAL_COMMUNITY)
Admission: EM | Admit: 2018-11-25 | Discharge: 2018-11-25 | Disposition: A | Discharge status: Home / Self Care | Payer: BLUE CROSS/BLUE SHIELD | Attending: Family Medicine | Admitting: Family Medicine

## 2018-11-25 ENCOUNTER — Encounter (HOSPITAL_COMMUNITY): Payer: Self-pay | Admitting: *Deleted

## 2018-11-25 ENCOUNTER — Other Ambulatory Visit: Payer: Self-pay

## 2018-11-25 ENCOUNTER — Encounter: Payer: Self-pay | Admitting: Family Medicine

## 2018-11-25 DIAGNOSIS — J029 Acute pharyngitis, unspecified: Secondary | ICD-10-CM

## 2018-11-25 MED ORDER — FLUTICASONE PROPIONATE 50 MCG/ACT NA SUSP: 2.0000 | Freq: Every day | NASAL | 0 refills | Status: DC

## 2018-11-25 MED ORDER — LIDOCAINE VISCOUS HCL 2 % MT SOLN: OROMUCOSAL | 0 refills | Status: DC

## 2018-11-25 MED ORDER — IPRATROPIUM BROMIDE 0.06 % NA SOLN: 2.0000 | Freq: Four times a day (QID) | NASAL | 0 refills | Status: DC

## 2018-11-25 NOTE — ED Triage Notes (Addendum)
Has had eval x 3 since 3/22 for sore throat.  Initially had neg strep & was given steroid shot.  Completed Augment Rx per pt, also had ceftriaxone inj per record, with improvement, but still is not completely better.  Has had "slight fever".  C/O difficulty swallowing due to sensation of swollen throat and c/o lymphadenopathy.

## 2018-11-25 NOTE — ED Provider Notes (Signed)
[facility]    CSN: [id number] Arrival date & time: 11/25/18  1050     History   Chief Complaint Chief Complaint  Patient presents with  . Appointment    1100  . Sore Throat    HPI Melanie Townsend is a 28 y.o. female.   28 year old female comes in for continued sore throat. She has been seen 3 times for similar symptoms, last visit 4/10 with PCP. States through visits, has had steroid injection, augmentin, rocephin injection with some improvement, but no complete relief. She has had subjective fever, headache, sneezing, mild cough. She has had painful swallowing without trouble breathing, swelling of the throat, tripoding, drooling, trismus. Denies nasal congestion, rhinorrhea, post nasal drip. Denies acid reflux symptoms, abdominal pain, nausea, vomiting. She has been referred to ENT by PCP, but given current COVID pandemic, has not been able to make appointment.      Past Medical History:  Diagnosis Date  . Headache associated with hormonal factors   . Hidradenitis suppurativa of right axilla 04/2018  . PMDD (premenstrual dysphoric disorder)     Patient Active Problem List   Diagnosis Date Noted  . Perirectal abscess 07/16/2018  . Rectal abscess 07/16/2018  . Class 2 obesity due to excess calories without serious comorbidity with body mass index (BMI) of 38.0 to 38.9 in adult 11/10/2017  . Hidradenitis axillaris 09/25/2017    Past Surgical History:  Procedure Laterality Date  . HYDRADENITIS EXCISION Left 02/26/2018   Procedure: EXCISION HIDRADENITIS TO THE LEFT AXILLA;  Surgeon: Louisa Secondruesdale, Gerald, MD;  Location: [facility];  Service: Plastics;  Laterality: Left;  . HYDRADENITIS EXCISION Right 04/30/2018   Procedure: EXCISION HIDRADENITIS RIGHT AXILLA;  Surgeon: Louisa Secondruesdale, Gerald, MD;  Location: [facility];  Service: Plastics;  Laterality: Right;  . PILONIDAL CYST EXCISION      OB History   No obstetric history on file.       Home Medications    Prior to Admission medications   Medication Sig Start Date End Date Taking? Authorizing Provider  DOXYCYCLINE HYCLATE PO Take by mouth.   Yes [provider]  fluticasone (FLONASE) 50 MCG/ACT nasal spray Place 2 sprays into both nostrils daily. 11/25/18   Cathie HoopsYu, Taishawn Smaldone V, PA-C  ipratropium (ATROVENT) 0.06 % nasal spray Place 2 sprays into both nostrils 4 (four) times daily. 11/25/18   Cathie HoopsYu, Mate Alegria V, PA-C  lidocaine (XYLOCAINE) 2 % solution 5-15 mL gurgle as needed 11/25/18   Cathie HoopsYu, Labib Cwynar V, PA-C  naproxen (NAPROSYN) 500 MG tablet Take 1 tablet (500 mg total) by mouth 2 (two) times daily. 11/04/18   Petrucelli, Samantha R, PA-C  PRESCRIPTION MEDICATION Take 600 mg by mouth See admin instructions. Boric Acid 600 mg capsules - insert one capsule (600 mg) vaginally daily at bedtime for 3 days following end of menstrual cycle    [provider]    Family History Family History  Problem Relation Age of Onset  . Crohn's disease Father   . Diabetes Maternal Uncle   . Diabetes Maternal Grandmother   . Diabetes Paternal Grandmother     Social History Social History   Tobacco Use  . Smoking status: Current Some Day Smoker    Packs/day: 0.33    Years: 5.00    Pack years: 1.65    Types: Cigarettes  . Smokeless tobacco: Never Used  Substance Use Topics  . Alcohol use: Yes    Comment: occasionally  . Drug use: No  Allergies   Iodine and Shellfish allergy   Review of Systems Review of Systems  Reason unable to perform ROS: See HPI as above.     Physical Exam Triage Vital Signs ED Triage Vitals  Enc Vitals Group     BP 11/25/18 1103 130/88     Pulse Rate 11/25/18 1103 72     Resp 11/25/18 1103 16     Temp 11/25/18 1103 98.3 F (36.8 C)     Temp Source 11/25/18 1103 Oral     SpO2 11/25/18 1103 100 %     Weight --      Height --      Head Circumference --      Peak Flow --      Pain Score 11/25/18 1104 8     Pain Loc --      Pain Edu? --       Excl. in GC? --    No data found.  Updated Vital Signs BP 130/88   Pulse 72   Temp 98.3 F (36.8 C) (Oral)   Resp 16   LMP 11/22/2018   SpO2 100%   Physical Exam Constitutional:      General: She is not in acute distress.    Appearance: She is well-developed. She is not ill-appearing, toxic-appearing or diaphoretic.  HENT:     Head: Normocephalic and atraumatic.     Right Ear: Tympanic membrane, ear canal and external ear normal. Tympanic membrane is not erythematous or bulging.     Left Ear: Tympanic membrane, ear canal and external ear normal. Tympanic membrane is not erythematous or bulging.     Nose: Congestion and rhinorrhea present.     Right Sinus: No maxillary sinus tenderness or frontal sinus tenderness.     Left Sinus: No maxillary sinus tenderness or frontal sinus tenderness.     Mouth/Throat:     Mouth: Mucous membranes are moist.     Pharynx: Oropharynx is clear. Uvula midline.     Tonsils: No tonsillar exudate or tonsillar abscesses. 3+ on the right. 3+ on the left.  Eyes:     Conjunctiva/sclera: Conjunctivae normal.     Pupils: Pupils are equal, round, and reactive to light.  Neck:     Musculoskeletal: Normal range of motion and neck supple.  Cardiovascular:     Rate and Rhythm: Normal rate and regular rhythm.     Heart sounds: Normal heart sounds. No murmur. No friction rub. No gallop.   Pulmonary:     Effort: Pulmonary effort is normal. No accessory muscle usage, prolonged expiration, respiratory distress or retractions.     Breath sounds: Normal breath sounds. No stridor, decreased air movement or transmitted upper airway sounds. No decreased breath sounds, wheezing, rhonchi or rales.  Skin:    General: Skin is warm and dry.  Neurological:     Mental Status: She is alert and oriented to person, place, and time.      UC Treatments / Results  Labs (all labs ordered are listed, but only abnormal results are displayed) Labs Reviewed - No data to  display  EKG None  Radiology No results found.  Procedures Procedures (including critical care time)  Medications Ordered in UC Medications - No data to display  Initial Impression / Assessment and Plan / UC Course  I have reviewed the triage vital signs and the nursing notes.  Pertinent labs & imaging results that were available during my care of the patient were reviewed by me and  considered in my medical decision making (see chart for details).    Patient worries for peritonsillar abscess. Enlarged tonsils without erythema, exudates. Uvula midline. Patient handling secretions well. Reassurance provided. Discussed possible post nasal drip causing symptoms. Flonase, atrovent as directed. Lidocaine for symptomatic treatment. Return precautions given.  Final Clinical Impressions(s) / UC Diagnoses   Final diagnoses:  Sore throat   ED Prescriptions    Medication Sig Dispense Auth. Provider   ipratropium (ATROVENT) 0.06 % nasal spray Place 2 sprays into both nostrils 4 (four) times daily. 15 mL Yaelis Scharfenberg V, PA-C   fluticasone (FLONASE) 50 MCG/ACT nasal spray Place 2 sprays into both nostrils daily. 1 g Tarrin Lebow V, PA-C   lidocaine (XYLOCAINE) 2 % solution 5-15 mL gurgle as needed 150 mL Threasa Alpha, [place] 11/25/18 1209

## 2018-11-25 NOTE — Discharge Instructions (Signed)
Start lidocaine for sore throat, do not eat or drink for the next 40 mins after use as it can stunt your gag reflex. Flonase, atrovent for nasal congestion/drainage. You can use over the counter nasal saline rinse such as neti pot for nasal congestion. Monitor for any worsening of symptoms, swelling of the throat, trouble breathing, trouble swallowing, leaning forward to breath, drooling, go to the emergency department for further evaluation needed.

## 2018-12-12 ENCOUNTER — Telehealth: Payer: Self-pay | Admitting: Family Medicine

## 2018-12-12 NOTE — Telephone Encounter (Signed)
Wants to now if she can get a work excuse because her throat is not getting any better and she is still not feeling well would like a call back

## 2018-12-13 ENCOUNTER — Encounter: Payer: BLUE CROSS/BLUE SHIELD | Admitting: Family Medicine

## 2018-12-13 ENCOUNTER — Other Ambulatory Visit: Payer: Self-pay

## 2018-12-13 NOTE — Telephone Encounter (Signed)
I have called the pt back. She had an appointment with Dr. Dorette Townsend today for a virtual appointment, however she wanted to reschedule to talk to her doctor because she feels like her doctor knows what is going on.  Chief Complaint via Triage today 12/13/2018 Ongoing issue Sore throat possibly due to enlarged tonsil. (works from home.)  What are her options?  Referrals has been placed and the ENT is not seeing pt right now due Pandemic.  Pt has reschedule the virtual visit for Dr. Creta Townsend for Monday 12/17/2018 at 840am.   Pt is requesting a letter out of work until she is able to be seen by ENT for her throat is killing her. She is suffering please advise.

## 2018-12-17 ENCOUNTER — Encounter: Payer: BLUE CROSS/BLUE SHIELD | Admitting: Family Medicine

## 2018-12-17 ENCOUNTER — Other Ambulatory Visit: Payer: Self-pay

## 2018-12-17 ENCOUNTER — Telehealth: Payer: Self-pay | Admitting: Family Medicine

## 2018-12-17 ENCOUNTER — Telehealth: Payer: BLUE CROSS/BLUE SHIELD | Admitting: Family Medicine

## 2018-12-17 NOTE — Telephone Encounter (Signed)
Pt called because she missed her appt this morning and is upset .needs to talk to Dr.Stallings about her sore throat ..would dr. Or her CNA please call her at [phone]   FR

## 2018-12-17 NOTE — Telephone Encounter (Signed)
11:34 a.m. Spoke with pt and she is c/o continued st after taking antibiotics and lidocaine.  Pt c/o intermittent fevers x 1 week, temps of 100.  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  Advised I would speak with Dr. Creta Levin and contact her back. 11:39 am  Per Dr. Eldred Manges pt needs to go to [facility] to be evaluated for Covid-19 since st and fever are symptoms of the virus.  Advised they will let [facility] know of its findings since we referred her to them and they will f/u as well.  Advised Dr. Creta Levin will follow what her status is and f/u as well.  Pt requesting note to be out of work since she continues to have the sore throat.  Advised once assessed any note need will be provided.  Pt agreeable and is on her way to [facility]) for evaluation. Dgaddy, CMA

## 2018-12-25 DIAGNOSIS — Z5329 Procedure and treatment not carried out because of patient's decision for other reasons: Secondary | ICD-10-CM | POA: Insufficient documentation

## 2018-12-25 DIAGNOSIS — Z91199 Patient's noncompliance with other medical treatment and regimen due to unspecified reason: Secondary | ICD-10-CM | POA: Insufficient documentation

## 2018-12-25 DIAGNOSIS — Z7689 Persons encountering health services in other specified circumstances: Secondary | ICD-10-CM | POA: Insufficient documentation

## 2018-12-25 NOTE — Progress Notes (Signed)
Pt was a no show

## 2018-12-28 DIAGNOSIS — J3503 Chronic tonsillitis and adenoiditis: Secondary | ICD-10-CM | POA: Diagnosis not present

## 2018-12-28 DIAGNOSIS — J353 Hypertrophy of tonsils with hypertrophy of adenoids: Secondary | ICD-10-CM | POA: Diagnosis not present

## 2019-01-02 DIAGNOSIS — N898 Other specified noninflammatory disorders of vagina: Secondary | ICD-10-CM | POA: Diagnosis not present

## 2019-01-02 DIAGNOSIS — N943 Premenstrual tension syndrome: Secondary | ICD-10-CM | POA: Diagnosis not present

## 2019-01-02 DIAGNOSIS — Z113 Encounter for screening for infections with a predominantly sexual mode of transmission: Secondary | ICD-10-CM | POA: Diagnosis not present

## 2019-01-09 NOTE — Progress Notes (Signed)
This encounter was created in error - please disregard.

## 2019-01-21 NOTE — Progress Notes (Signed)
This encounter was created in error - please disregard.

## 2019-01-22 NOTE — Telephone Encounter (Signed)
NO SHOW

## 2019-02-12 ENCOUNTER — Other Ambulatory Visit: Payer: Self-pay | Admitting: Otolaryngology

## 2019-03-01 ENCOUNTER — Encounter (HOSPITAL_BASED_OUTPATIENT_CLINIC_OR_DEPARTMENT_OTHER): Payer: Self-pay | Admitting: *Deleted

## 2019-03-01 ENCOUNTER — Telehealth: Payer: Self-pay | Admitting: Family Medicine

## 2019-03-01 ENCOUNTER — Other Ambulatory Visit: Payer: Self-pay

## 2019-03-01 NOTE — Telephone Encounter (Signed)
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

## 2019-03-07 ENCOUNTER — Other Ambulatory Visit (HOSPITAL_COMMUNITY)
Admission: RE | Admit: 2019-03-07 | Discharge: 2019-03-07 | Disposition: A | Discharge status: Home / Self Care | Payer: BC Managed Care – PPO | Source: Ambulatory Visit | Attending: Otolaryngology | Admitting: Otolaryngology

## 2019-03-07 DIAGNOSIS — Z1159 Encounter for screening for other viral diseases: Secondary | ICD-10-CM | POA: Diagnosis not present

## 2019-03-07 LAB — SARS CORONAVIRUS 2 (TAT 6-24 HRS): SARS Coronavirus 2: NEGATIVE

## 2019-03-10 NOTE — Anesthesia Preprocedure Evaluation (Addendum)
Anesthesia Evaluation  Patient identified by MRN, date of birth, ID band Patient awake    Reviewed: Allergy & Precautions, NPO status , Patient's Chart, lab work & pertinent test results  History of Anesthesia Complications Negative for: history of anesthetic complications  Airway Mallampati: II  TM Distance: >3 FB Neck ROM: Full    Dental  (+) Teeth Intact   Pulmonary Current Smoker,    breath sounds clear to auscultation       Cardiovascular negative cardio ROS   Rhythm:Regular     Neuro/Psych  Headaches, PSYCHIATRIC DISORDERS Depression    GI/Hepatic negative GI ROS, Neg liver ROS,   Endo/Other  Morbid obesity  Renal/GU negative Renal ROS     Musculoskeletal negative musculoskeletal ROS (+)   Abdominal (+) + obese,   Peds  Hematology negative hematology ROS (+)   Anesthesia Other Findings   Reproductive/Obstetrics                            Anesthesia Physical  Anesthesia Plan  ASA: II  Anesthesia Plan: General   Post-op Pain Management:    Induction: Intravenous  PONV Risk Score and Plan: 2 and Ondansetron, Treatment may vary due to age or medical condition, Scopolamine patch - Pre-op and Dexamethasone  Airway Management Planned: LMA and Oral ETT  Additional Equipment: None  Intra-op Plan:   Post-operative Plan: Extubation in OR  Informed Consent: I have reviewed the patients History and Physical, chart, labs and discussed the procedure including the risks, benefits and alternatives for the proposed anesthesia with the patient or authorized representative who has indicated his/her understanding and acceptance.     Dental advisory given  Plan Discussed with: Surgeon, CRNA and Anesthesiologist  Anesthesia Plan Comments:        Anesthesia Quick Evaluation

## 2019-03-11 ENCOUNTER — Other Ambulatory Visit: Payer: Self-pay

## 2019-03-11 ENCOUNTER — Encounter (HOSPITAL_BASED_OUTPATIENT_CLINIC_OR_DEPARTMENT_OTHER)
Admission: RE | Disposition: A | Discharge status: Home / Self Care | Payer: Self-pay | Source: Home / Self Care | Attending: Otolaryngology

## 2019-03-11 ENCOUNTER — Ambulatory Visit (HOSPITAL_BASED_OUTPATIENT_CLINIC_OR_DEPARTMENT_OTHER)
Admission: RE | Admit: 2019-03-11 | Discharge: 2019-03-11 | Disposition: A | Discharge status: Home / Self Care | Payer: BC Managed Care – PPO | Attending: Otolaryngology | Admitting: Otolaryngology

## 2019-03-11 ENCOUNTER — Ambulatory Visit (HOSPITAL_BASED_OUTPATIENT_CLINIC_OR_DEPARTMENT_OTHER): Payer: BC Managed Care – PPO | Admitting: Anesthesiology

## 2019-03-11 ENCOUNTER — Encounter (HOSPITAL_BASED_OUTPATIENT_CLINIC_OR_DEPARTMENT_OTHER): Payer: Self-pay | Admitting: *Deleted

## 2019-03-11 DIAGNOSIS — J353 Hypertrophy of tonsils with hypertrophy of adenoids: Secondary | ICD-10-CM | POA: Diagnosis not present

## 2019-03-11 DIAGNOSIS — J3503 Chronic tonsillitis and adenoiditis: Secondary | ICD-10-CM | POA: Diagnosis not present

## 2019-03-11 DIAGNOSIS — F17210 Nicotine dependence, cigarettes, uncomplicated: Secondary | ICD-10-CM | POA: Diagnosis not present

## 2019-03-11 DIAGNOSIS — Z6839 Body mass index (BMI) 39.0-39.9, adult: Secondary | ICD-10-CM | POA: Diagnosis not present

## 2019-03-11 DIAGNOSIS — J3501 Chronic tonsillitis: Secondary | ICD-10-CM | POA: Insufficient documentation

## 2019-03-11 DIAGNOSIS — F329 Major depressive disorder, single episode, unspecified: Secondary | ICD-10-CM | POA: Diagnosis not present

## 2019-03-11 DIAGNOSIS — J029 Acute pharyngitis, unspecified: Secondary | ICD-10-CM | POA: Insufficient documentation

## 2019-03-11 HISTORY — PX: TONSILLECTOMY AND ADENOIDECTOMY: SHX28

## 2019-03-11 LAB — POCT PREGNANCY, URINE: Preg Test, Ur: NEGATIVE

## 2019-03-11 SURGERY — TONSILLECTOMY AND ADENOIDECTOMY
Anesthesia: General | Site: Mouth | Laterality: Bilateral

## 2019-03-11 MED ORDER — OXYCODONE HCL 5 MG PO TABS
5.0000 mg | ORAL_TABLET | Freq: Once | ORAL | Status: AC | PRN
  Administered 2019-03-11: 5 mg via ORAL

## 2019-03-11 MED ORDER — FENTANYL CITRATE (PF) 100 MCG/2ML IJ SOLN
INTRAMUSCULAR | Status: AC
  Filled 2019-03-11: qty 2

## 2019-03-11 MED ORDER — OXYCODONE HCL 5 MG/5ML PO SOLN: 5.0000 mg | Freq: Once | ORAL | Status: AC | PRN

## 2019-03-11 MED ORDER — LACTATED RINGERS IV SOLN
INTRAVENOUS | Status: DC
  Administered 2019-03-11: 07:00:00 via INTRAVENOUS

## 2019-03-11 MED ORDER — SUCCINYLCHOLINE CHLORIDE 20 MG/ML IJ SOLN
INTRAMUSCULAR | Status: DC | PRN
  Administered 2019-03-11: 160 mg via INTRAVENOUS

## 2019-03-11 MED ORDER — ACETAMINOPHEN 160 MG/5ML PO SOLN: 325.0000 mg | ORAL | Status: DC | PRN

## 2019-03-11 MED ORDER — DEXAMETHASONE SODIUM PHOSPHATE 4 MG/ML IJ SOLN
INTRAMUSCULAR | Status: DC | PRN
  Administered 2019-03-11: 10 mg via INTRAVENOUS

## 2019-03-11 MED ORDER — DEXAMETHASONE SODIUM PHOSPHATE 10 MG/ML IJ SOLN
INTRAMUSCULAR | Status: AC
  Filled 2019-03-11: qty 1

## 2019-03-11 MED ORDER — AMOXICILLIN 400 MG/5ML PO SUSR: 800.0000 mg | Freq: Two times a day (BID) | ORAL | 0 refills | Status: AC

## 2019-03-11 MED ORDER — OXYCODONE HCL 5 MG PO TABS
ORAL_TABLET | ORAL | Status: AC
  Filled 2019-03-11: qty 1

## 2019-03-11 MED ORDER — FENTANYL CITRATE (PF) 100 MCG/2ML IJ SOLN
50.0000 ug | INTRAMUSCULAR | Status: DC | PRN
  Administered 2019-03-11: 07:00:00 100 ug via INTRAVENOUS

## 2019-03-11 MED ORDER — ACETAMINOPHEN 325 MG PO TABS: 325.0000 mg | ORAL_TABLET | ORAL | Status: DC | PRN

## 2019-03-11 MED ORDER — SCOPOLAMINE 1 MG/3DAYS TD PT72
1.0000 | MEDICATED_PATCH | Freq: Once | TRANSDERMAL | Status: DC
  Administered 2019-03-11: 1.5 mg via TRANSDERMAL

## 2019-03-11 MED ORDER — PROPOFOL 500 MG/50ML IV EMUL
INTRAVENOUS | Status: DC | PRN
  Administered 2019-03-11: 50 ug/kg/min via INTRAVENOUS

## 2019-03-11 MED ORDER — MIDAZOLAM HCL 2 MG/2ML IJ SOLN
INTRAMUSCULAR | Status: AC
  Filled 2019-03-11: qty 2

## 2019-03-11 MED ORDER — LIDOCAINE 2% (20 MG/ML) 5 ML SYRINGE
INTRAMUSCULAR | Status: DC | PRN
  Administered 2019-03-11: 100 mg via INTRAVENOUS

## 2019-03-11 MED ORDER — PROPOFOL 10 MG/ML IV BOLUS
INTRAVENOUS | Status: DC | PRN
  Administered 2019-03-11: 200 mg via INTRAVENOUS

## 2019-03-11 MED ORDER — ONDANSETRON HCL 4 MG/2ML IJ SOLN: 4.0000 mg | Freq: Once | INTRAMUSCULAR | Status: DC | PRN

## 2019-03-11 MED ORDER — OXYMETAZOLINE HCL 0.05 % NA SOLN
NASAL | Status: DC | PRN
  Administered 2019-03-11: 1 via TOPICAL

## 2019-03-11 MED ORDER — MIDAZOLAM HCL 2 MG/2ML IJ SOLN
1.0000 mg | INTRAMUSCULAR | Status: DC | PRN
  Administered 2019-03-11: 2 mg via INTRAVENOUS

## 2019-03-11 MED ORDER — MEPERIDINE HCL 25 MG/ML IJ SOLN: 6.2500 mg | INTRAMUSCULAR | Status: DC | PRN

## 2019-03-11 MED ORDER — OXYCODONE-ACETAMINOPHEN 5-325 MG PO TABS: 1.0000 | ORAL_TABLET | ORAL | 0 refills | Status: AC | PRN

## 2019-03-11 MED ORDER — PROPOFOL 500 MG/50ML IV EMUL
INTRAVENOUS | Status: AC
  Filled 2019-03-11: qty 50

## 2019-03-11 MED ORDER — SODIUM CHLORIDE 0.9 % IR SOLN
Status: DC | PRN
  Administered 2019-03-11: 500 mL

## 2019-03-11 MED ORDER — OXYMETAZOLINE HCL 0.05 % NA SOLN
NASAL | Status: AC
  Filled 2019-03-11: qty 30

## 2019-03-11 MED ORDER — ONDANSETRON HCL 4 MG/2ML IJ SOLN
INTRAMUSCULAR | Status: AC
  Filled 2019-03-11: qty 2

## 2019-03-11 MED ORDER — ONDANSETRON HCL 4 MG/2ML IJ SOLN
INTRAMUSCULAR | Status: DC | PRN
  Administered 2019-03-11: 4 mg via INTRAVENOUS

## 2019-03-11 MED ORDER — FENTANYL CITRATE (PF) 100 MCG/2ML IJ SOLN
25.0000 ug | INTRAMUSCULAR | Status: DC | PRN
  Administered 2019-03-11: 50 ug via INTRAVENOUS
  Administered 2019-03-11 (×2): 25 ug via INTRAVENOUS
  Administered 2019-03-11: 50 ug via INTRAVENOUS

## 2019-03-11 SURGICAL SUPPLY — 29 items
BNDG COHESIVE 2X5 TAN STRL LF (GAUZE/BANDAGES/DRESSINGS) IMPLANT
CANISTER SUCT 1200ML W/VALVE (MISCELLANEOUS) ×2 IMPLANT
CATH ROBINSON RED A/P 10FR (CATHETERS) IMPLANT
CATH ROBINSON RED A/P 14FR (CATHETERS) ×1 IMPLANT
COAGULATOR SUCT SWTCH 10FR 6 (ELECTROSURGICAL) IMPLANT
COVER BACK TABLE REUSABLE LG (DRAPES) ×2 IMPLANT
COVER MAYO STAND REUSABLE (DRAPES) ×2 IMPLANT
COVER WAND RF STERILE (DRAPES) IMPLANT
DRAPE HALF SHEET 70X43 (DRAPES) ×2 IMPLANT
ELECT REM PT RETURN 9FT ADLT (ELECTROSURGICAL) ×2
ELECT REM PT RETURN 9FT PED (ELECTROSURGICAL)
ELECTRODE REM PT RETRN 9FT PED (ELECTROSURGICAL) IMPLANT
ELECTRODE REM PT RTRN 9FT ADLT (ELECTROSURGICAL) IMPLANT
GAUZE SPONGE 4X4 12PLY STRL LF (GAUZE/BANDAGES/DRESSINGS) ×2 IMPLANT
GLOVE BIO SURGEON STRL SZ7.5 (GLOVE) ×2 IMPLANT
GOWN STRL REUS W/ TWL LRG LVL3 (GOWN DISPOSABLE) ×2 IMPLANT
GOWN STRL REUS W/TWL LRG LVL3 (GOWN DISPOSABLE) ×2
IV NS 500ML (IV SOLUTION) ×1
IV NS 500ML BAXH (IV SOLUTION) ×1 IMPLANT
MARKER SKIN DUAL TIP RULER LAB (MISCELLANEOUS) ×1 IMPLANT
NS IRRIG 1000ML POUR BTL (IV SOLUTION) ×1 IMPLANT
SOLUTION BUTLER CLEAR DIP (MISCELLANEOUS) ×2 IMPLANT
SPONGE TONSIL TAPE 1.25 RFD (DISPOSABLE) ×2 IMPLANT
SYR BULB 3OZ (MISCELLANEOUS) IMPLANT
TOWEL GREEN STERILE FF (TOWEL DISPOSABLE) ×2 IMPLANT
TUBE CONNECTING 20X1/4 (TUBING) ×2 IMPLANT
TUBE SALEM SUMP 12R W/ARV (TUBING) IMPLANT
TUBE SALEM SUMP 16 FR W/ARV (TUBING) ×1 IMPLANT
WAND COBLATOR 70 EVAC XTRA (SURGICAL WAND) ×2 IMPLANT

## 2019-03-11 NOTE — H&P (Signed)
Cc: Chronic tonsillitis/pharyngitis  HPI: The patient is a 28 y/o female who presents today for evaluation of chronic tonsillitis. The patient is seen in consultation requested by Primary Care at [facility]. According to the patient, she has noted issues with her tonsils for most of her life but the frequency of her symptoms has increased in the past 3 years. The patient notes enlarged tonsils with frequent throat soreness/tonsillitis. She has been symptomatic almost every month. She also experiences tonsilloliths and halitosis. The patient's family also notes that she snore loudly with possible apnea episodes. Previous ENT surgery is denied.   The patient's review of systems (constitutional, eyes, ENT, cardiovascular, respiratory, GI, musculoskeletal, skin, neurologic, psychiatric, endocrine, hematologic, allergic) is noted in the ROS questionnaire.  It is reviewed with the patient.   Family health history: No HTN, CAD, DM, hearing loss or bleeding disorder .  Major events: Perianal abscess.  Ongoing medical problems: None.  Social history: The patient is single. She denies the use of alcohol or illegal drugs. She smokes about 5 cigarettes a day.   Exam: General: Communicates without difficulty, well nourished, no acute distress. Head:  Normocephalic, no lesions or asymmetry. Eyes: PERRL, EOMI. No scleral icterus, conjunctivae clear.  Neuro: CN II exam reveals vision grossly intact.  No nystagmus at any point of gaze. Ears:  EAC normal without erythema AU.  TM intact without fluid and mobile AU. Nose: Moist, pink mucosa without lesions or mass. Mouth: Oral cavity clear and moist, no lesions, tonsils symmetric. Tonsils are 3+, cyrptic with multiple tonsilloliths. Neck: Full range of motion, no lymphadenopathy or masses.   Assessment 1.  The patient's history and physical exam findings are consistent with chronic tonsillitis/pharyngitis secondary to adenotonsillar hypertrophy.  Plan  1. The treatment  options include continuing conservative observation versus adenotonsillectomy.  Based on the patient's history and physical exam findings, the patient will likely benefit from having the tonsils and adenoid removed.  The risks, benefits, alternatives, and details of the procedure are reviewed with the patient.  Questions are invited and answered.  2. The patient is interested in proceeding with the procedure.  We will schedule the procedure in accordance with the family schedule.

## 2019-03-11 NOTE — Anesthesia Procedure Notes (Addendum)
Procedure Name: Intubation Date/Time: 03/11/2019 7:32 AM Performed by: Maryella Shivers, CRNA Pre-anesthesia Checklist: Patient identified, Emergency Drugs available, Suction available and Patient being monitored Patient Re-evaluated:Patient Re-evaluated prior to induction Oxygen Delivery Method: Circle system utilized Preoxygenation: Pre-oxygenation with 100% oxygen Induction Type: IV induction Ventilation: Mask ventilation without difficulty Laryngoscope Size: Mac and 3 Grade View: Grade I Tube type: Oral Tube size: 7.0 mm Number of attempts: 1 Airway Equipment and Method: Stylet and Oral airway Placement Confirmation: ETT inserted through vocal cords under direct vision,  positive ETCO2 and breath sounds checked- equal and bilateral Secured at: 20 cm Tube secured with: Tape Dental Injury: Teeth and Oropharynx as per pre-operative assessment

## 2019-03-11 NOTE — Transfer of Care (Signed)
Immediate Anesthesia Transfer of Care Note  Patient: Melanie Townsend  Procedure(s) Performed: TONSILLECTOMY AND ADENOIDECTOMY (Bilateral Mouth)  Patient Location: PACU  Anesthesia Type:General  Level of Consciousness: awake, alert  and oriented  Airway & Oxygen Therapy: Patient Spontanous Breathing and Patient connected to face mask oxygen  Post-op Assessment: Report given to RN and Post -op Vital signs reviewed and stable  Post vital signs: Reviewed and stable  Last Vitals:  Vitals Value Taken Time  BP 124/88 03/11/19 0810  Temp    Pulse 77 03/11/19 0813  Resp 15 03/11/19 0813  SpO2 100 % 03/11/19 0813  Vitals shown include unvalidated device data.  Last Pain:  Vitals:   03/11/19 0633  TempSrc: Oral      Patients Stated Pain Goal: 1 (02/54/27 0623)  Complications: No apparent anesthesia complications

## 2019-03-11 NOTE — Anesthesia Postprocedure Evaluation (Signed)
Anesthesia Post Note  Patient: Melanie Townsend  Procedure(s) Performed: TONSILLECTOMY AND ADENOIDECTOMY (Bilateral Mouth)     Patient location during evaluation: PACU Anesthesia Type: General Level of consciousness: awake and alert Pain management: pain level controlled Vital Signs Assessment: post-procedure vital signs reviewed and stable Respiratory status: spontaneous breathing, nonlabored ventilation, respiratory function stable and patient connected to nasal cannula oxygen Cardiovascular status: blood pressure returned to baseline and stable Postop Assessment: no apparent nausea or vomiting Anesthetic complications: no    Last Vitals:  Vitals:   03/11/19 0845 03/11/19 0900  BP: (!) 136/98 (!) 129/97  Pulse: 76 74  Resp: 16 15  Temp:    SpO2: 100% 99%    Last Pain:  Vitals:   03/11/19 0945  TempSrc:   PainSc: 7                  Canaan Prue

## 2019-03-11 NOTE — Op Note (Signed)
DATE OF PROCEDURE:  03/11/2019                              OPERATIVE REPORT  SURGEON:  Leta Baptist, MD  PREOPERATIVE DIAGNOSES: 1. Adenotonsillar hypertrophy. 2. Chronic tonsillitis and pharyngitis  POSTOPERATIVE DIAGNOSES: 1. Adenotonsillar hypertrophy. 2. Chronic tonsillitis and pharyngitis  PROCEDURE PERFORMED:  Adenotonsillectomy.  ANESTHESIA:  General endotracheal tube anesthesia.  COMPLICATIONS:  None.  ESTIMATED BLOOD LOSS:  Minimal.  INDICATION FOR PROCEDURE:  Melanie Townsend is a 28 y.o. female with a history of chronic tonsillitis/pharyngitis and halitosis.  According to the patient, she has been experiencing chronic throat discomfort with halitosis for several years. The patient continues to be symptomatic despite medical treatments. On examination, the patient was noted to have bilateral cryptic tonsils, with numerous tonsilloliths. Based on the above findings, the decision was made for the patient to undergo the adenotonsillectomy procedure. Likelihood of success in reducing symptoms was also discussed.  The risks, benefits, alternatives, and details of the procedure were discussed with the patient.  Questions were invited and answered.  Informed consent was obtained.  DESCRIPTION:  The patient was taken to the operating room and placed supine on the operating table.  General endotracheal tube anesthesia was administered by the anesthesiologist.  The patient was positioned and prepped and draped in a standard fashion for adenotonsillectomy.  A Crowe-Davis mouth gag was inserted into the oral cavity for exposure. 4+ cryptic tonsils were noted bilaterally.  No bifidity was noted.  Indirect mirror examination of the nasopharynx revealed mild adenoid hypertrophy. The adenoid was ablated with the Coblator device. Hemostasis was achieved with the Coblator device.  The right tonsil was then grasped with a straight Allis clamp and retracted medially.  It was resected free from the  underlying pharyngeal constrictor muscles with the Coblator device.  The same procedure was repeated on the left side without exception.  The surgical sites were copiously irrigated.  The mouth gag was removed.  The care of the patient was turned over to the anesthesiologist.  The patient was awakened from anesthesia without difficulty.  The patient was extubated and transferred to the recovery room in good condition.  OPERATIVE FINDINGS:  Adenotonsillar hypertrophy.  SPECIMEN: None.  FOLLOWUP CARE:  The patient will be discharged home once awake and alert.  She will be placed on amoxicillin 800 mg p.o. b.i.d. for 5 days, and percocet for postop pain control.   The patient will follow up in my office in approximately 2 weeks.  Kawhi Diebold W Ashley Bultema 03/11/2019 8:03 AM

## 2019-03-11 NOTE — Discharge Instructions (Addendum)

## 2019-03-12 ENCOUNTER — Encounter (HOSPITAL_BASED_OUTPATIENT_CLINIC_OR_DEPARTMENT_OTHER): Payer: Self-pay | Admitting: Otolaryngology

## 2019-03-18 ENCOUNTER — Telehealth: Payer: Self-pay | Admitting: Family Medicine

## 2019-03-18 NOTE — Telephone Encounter (Signed)
Pt called in regards to ppr wrk dropped off 7/17/ Requested for Delores to cb

## 2019-03-19 ENCOUNTER — Telehealth: Payer: Self-pay

## 2019-03-19 NOTE — Telephone Encounter (Signed)
Returned pt call no answer no vm left as I left vm earlier.  Will await pt c/b. Dgaddy, CMA

## 2019-03-19 NOTE — Telephone Encounter (Signed)
Left message on voicemail that we do have her paperwork and will check with dr Nolon Rod on status of it and if she has completed it.  Advised I would send her message and give me until tomorrow to see where she is in the process and I will contact her back with status or if she has any further questions or concerns to call me back (Elton Heid). Dgaddy, CMA

## 2019-03-20 NOTE — Telephone Encounter (Signed)
Completed accomodation and ready for faxing. Will route to  [employer]	[place].

## 2019-03-20 NOTE — Telephone Encounter (Signed)
Pt called back. Please advise.

## 2019-03-21 NOTE — Telephone Encounter (Signed)
Accomodation forms faxed back to [place] attn: Hanley Seamen at [phone].  Confirmation fax received at 12:11 pm on 03/20/2019.  Spoke with pt and she will pickup original and I will keep a copy on hand until it has been successfully scanned into pt chart. Dgaddy, CMA

## 2019-04-11 ENCOUNTER — Telehealth: Payer: Self-pay | Admitting: Family Medicine

## 2019-04-11 NOTE — Telephone Encounter (Signed)
Copied from Bessie [id number]. Topic: General - Other >> Apr 11, 2019  3:53 PM Yvette Rack wrote: Reason for CRM: Pt requests to speak with Delores. Pt requests call back. Cb# [phone]

## 2019-04-11 NOTE — Telephone Encounter (Signed)
Copied from CRM #[id number]. Topic: General - Other °>> Apr 11, 2019  3:53 PM Mcneil, Ja-Kwan wrote: °Reason for CRM: Pt requests to speak with Delores. Pt requests call back. Cb# [phone] °

## 2019-04-12 NOTE — Telephone Encounter (Signed)
Please advise 

## 2019-04-12 NOTE — Telephone Encounter (Signed)
Pt's job is needing additional info as to why she needs a standing chair.  Per pt they need additional info re: boils causing her to be unable to sit for long period of time.  They are requesting additional info supporting standing chair be sent back at same fax number we faxed the original forms.  Advised pt provider on 3 week vacation but would send message, per pt she advised her job dr Nolon Rod is busy and may not get to it right away. Dgaddy, CMA

## 2019-04-16 NOTE — Telephone Encounter (Signed)
Noted! Thank you

## 2019-04-30 ENCOUNTER — Telehealth: Payer: Self-pay | Admitting: Family Medicine

## 2019-04-30 NOTE — Telephone Encounter (Signed)
Pt calling back to speed up the paperwork for her to have a standing desk and accomodation letter.  Pt would like a call back please

## 2019-04-30 NOTE — Telephone Encounter (Signed)
Pt called and needing her paperwork to be completed ASAP. Pt states her job is on the line. See notes

## 2019-04-30 NOTE — Telephone Encounter (Signed)
See previous note

## 2019-04-30 NOTE — Telephone Encounter (Signed)
Spoke with pt an advised dr Nolon Rod has stated clearly in fmla paperwork why the restrictions and accomodations are needed and that's all she can do.  Pt states they want to know in depth why she can't stand and why she must sit -this info is answered in paperwork completed.  Pt gave [place] the contact at [employer] to whom we need to speak to on her behalf.  Her name is Melanie Townsend at [phone].  Left message on voicemail to return my call. Dgaddy, CMA

## 2019-05-01 ENCOUNTER — Telehealth: Payer: Self-pay

## 2019-05-01 NOTE — Telephone Encounter (Signed)
Ms. Annamaria Helling calling to advise pt requestin sit/stand desk for work.  If this is a recommendation of dr. Nolon Rod for pt it will need to be written on office letterhead, by email or on rx paper stating the need for sit/stand desk and faxed to [phone] attn: Kennon Portela this her direct fax machine.  I advised would make provider aware and contact her back-agreeable. Dgaddy, CMA

## 2019-06-26 ENCOUNTER — Encounter: Payer: Self-pay | Admitting: Family Medicine

## 2019-06-26 ENCOUNTER — Ambulatory Visit (INDEPENDENT_AMBULATORY_CARE_PROVIDER_SITE_OTHER): Payer: BC Managed Care – PPO | Admitting: Family Medicine

## 2019-06-26 ENCOUNTER — Other Ambulatory Visit: Payer: Self-pay

## 2019-06-26 VITALS — BP 112/80 | HR 77 | Temp 98.2°F | Ht 66.0 in | Wt 226.4 lb

## 2019-06-26 DIAGNOSIS — K611 Rectal abscess: Secondary | ICD-10-CM

## 2019-06-26 DIAGNOSIS — F4323 Adjustment disorder with mixed anxiety and depressed mood: Secondary | ICD-10-CM | POA: Diagnosis not present

## 2019-06-26 NOTE — Progress Notes (Signed)
Established Patient Office Visit  Subjective:  Patient ID: Melanie Townsend, female    DOB: 1990-11-18  Age: 28 y.o. MRN: [id number]  CC:  Chief Complaint  Patient presents with  . Paperwork for work    follow up on needing tools to work from home to be provided by the company     HPI [employer] presents for   Patient reports that she has a standing desk at work but that she is not able to get the standing desk for home.  She reports that it is stressing her out because the building is closed and she cannot comfortably work from home due to the recurrent boils and the pain with sitting.  Depression screen [facility] 2/9 06/26/2019 11/23/2018 11/06/2018 10/17/2018 08/22/2018  Decreased Interest 2 0 3 3 3   Down, Depressed, Hopeless 2 0 3 3 3   PHQ - 2 Score 4 0 6 6 6   Altered sleeping 2 - - 3 3  Tired, decreased energy 2 - - 3 3  Change in appetite 3 - - 3 3  Feeling bad or failure about yourself  1 - - 3 3  Trouble concentrating 1 - - 3 3  Moving slowly or fidgety/restless 1 - - 0 1  Suicidal thoughts 1 - - 1 1  PHQ-9 Score 15 - - 22 23  Difficult doing work/chores Extremely dIfficult - - - Very difficult   She reports that she has a history of boils and she feels like it is embarrassing to have to explain that to them.  She states that she does not know why she cannot get the same accommodations for home office that she had when she was at the work office.   Hidradenitis Suppurativa/ Perirectal Abscess She reports that she gets flares before her periods She states that she is now getting boils on the labia of the vagina She is trying to get a new dermatologist She states that she would like to see a specialist for her rectal abscess  Obesity She reports that she is eating less due to having her tonsils removed and having to change her diet while she was recovering She reports that she is depressed she overeats but lately she has not had much taste for food since getting  her tonsils removed She is not exercising Wt Readings from Last 3 Encounters:  06/26/19 226 lb 6.4 oz (102.7 kg)  03/11/19 238 lb 5.1 oz (108.1 kg)  11/23/18 244 lb 9.6 oz (110.9 kg)     Past Medical History:  Diagnosis Date  . Headache associated with hormonal factors   . Hidradenitis suppurativa of right axilla 04/2018  . PMDD (premenstrual dysphoric disorder)     Past Surgical History:  Procedure Laterality Date  . HYDRADENITIS EXCISION Left 02/26/2018   Procedure: EXCISION HIDRADENITIS TO THE LEFT AXILLA;  Surgeon: 01/23/19, MD;  Location: [facility];  Service: Plastics;  Laterality: Left;  . HYDRADENITIS EXCISION Right 04/30/2018   Procedure: EXCISION HIDRADENITIS RIGHT AXILLA;  Surgeon: 02/28/2018, MD;  Location: [facility];  Service: Plastics;  Laterality: Right;  . PILONIDAL CYST EXCISION    . TONSILLECTOMY AND ADENOIDECTOMY Bilateral 03/11/2019   Procedure: TONSILLECTOMY AND ADENOIDECTOMY;  Surgeon: 05/02/2018, MD;  Location: [facility];  Service: ENT;  Laterality: Bilateral;    Family History  Problem Relation Age of Onset  . Crohn's disease Father   . Diabetes Maternal Uncle   . Diabetes Maternal Grandmother   .  Diabetes Paternal Grandmother     Social History   Socioeconomic History  . Marital status: Single    Spouse name: Not on file  . Number of children: Not on file  . Years of education: Not on file  . Highest education level: Not on file  Occupational History  . Not on file  Social Needs  . Financial resource strain: Not on file  . Food insecurity    Worry: Not on file    Inability: Not on file  . Transportation needs    Medical: Not on file    Non-medical: Not on file  Tobacco Use  . Smoking status: Current Some Day Smoker    Packs/day: 0.33    Years: 5.00    Pack years: 1.65    Types: Cigarettes  . Smokeless tobacco: Never Used  Substance and Sexual Activity  . Alcohol use: Yes     Comment: occasionally  . Drug use: No  . Sexual activity: Not on file  Lifestyle  . Physical activity    Days per week: Not on file    Minutes per session: Not on file  . Stress: Not on file  Relationships  . Social Musicianconnections    Talks on phone: Not on file    Gets together: Not on file    Attends religious service: Not on file    Active member of club or organization: Not on file    Attends meetings of clubs or organizations: Not on file    Relationship status: Not on file  . Intimate partner violence    Fear of current or ex partner: Not on file    Emotionally abused: Not on file    Physically abused: Not on file    Forced sexual activity: Not on file  Other Topics Concern  . Not on file  Social History Narrative  . Not on file    Outpatient Medications Prior to Visit  Medication Sig Dispense Refill  . clindamycin (CLEOCIN) 300 MG capsule TK 1 C PO TID    . PRESCRIPTION MEDICATION Take 600 mg by mouth See admin instructions. Boric Acid 600 mg capsules - insert one capsule (600 mg) vaginally daily at bedtime for 3 days following end of menstrual cycle    . PROBIOTIC PRODUCT PO Take by mouth.    . DOXYCYCLINE HYCLATE PO Take by mouth.     No facility-administered medications prior to visit.     Allergies  Allergen Reactions  . Iodine Hives       . Shellfish Allergy Hives    ROS Review of Systems    Objective:    Physical Exam  BP 112/80   Pulse 77   Temp 98.2 F (36.8 C) (Oral)   Ht 5\' 6"  (1.676 m)   Wt 226 lb 6.4 oz (102.7 kg)   LMP 06/12/2019   SpO2 99%   BMI 36.54 kg/m  Wt Readings from Last 3 Encounters:  06/26/19 226 lb 6.4 oz (102.7 kg)  03/11/19 238 lb 5.1 oz (108.1 kg)  11/23/18 244 lb 9.6 oz (110.9 kg)     Health Maintenance Due  Topic Date Due  . TETANUS/TDAP  02/13/2012  . INFLUENZA VACCINE  03/16/2019    There are no preventive care reminders to display for this patient.  Lab Results  Component Value Date   TSH 0.89  09/13/2017   Lab Results  Component Value Date   WBC 27.0 (H) 07/22/2018   HGB 11.2 (L) 07/22/2018  HCT 37.1 07/22/2018   MCV 87.9 07/22/2018   PLT 440 (H) 07/22/2018   Lab Results  Component Value Date   NA 135 07/22/2018   K 3.7 07/22/2018   CO2 21 (L) 07/22/2018   GLUCOSE 110 (H) 07/22/2018   BUN 7 07/22/2018   CREATININE 0.78 07/22/2018   BILITOT 0.6 07/22/2018   ALKPHOS 65 07/22/2018   AST 22 07/22/2018   ALT 27 07/22/2018   PROT 7.9 07/22/2018   ALBUMIN 3.3 (L) 07/22/2018   CALCIUM 9.1 07/22/2018   ANIONGAP 12 07/22/2018   GFR 154.22 09/13/2017   Lab Results  Component Value Date   CHOL 135 09/13/2017   Lab Results  Component Value Date   HDL 36.30 (L) 09/13/2017   Lab Results  Component Value Date   LDLCALC 84 09/13/2017   Lab Results  Component Value Date   TRIG 73.0 09/13/2017   Lab Results  Component Value Date   CHOLHDL 4 09/13/2017   Lab Results  Component Value Date   HGBA1C 5.6 09/13/2017      Assessment & Plan:   Problem List Items Addressed This Visit      Digestive   Rectal abscess - Primary   Relevant Orders   Ambulatory referral to General Surgery     Other   Perirectal abscess    Other Visit Diagnoses    Adjustment disorder with mixed anxiety and depressed mood          Discussed that she should try to get a lap top Discussed that she should forward letter to HR Also referred patient to the anorectal surgeon for follow up Discussed that she should send any additional forms for accomodation   No orders of the defined types were placed in this encounter.   Follow-up: No follow-ups on file.    Forrest Moron, MD

## 2019-06-26 NOTE — Patient Instructions (Signed)
° ° ° °  If you have lab work done today you will be contacted with your lab results within the next 2 weeks.  If you have not heard from us then please contact us. The fastest way to get your results is to register for My Chart. ° ° °IF you received an x-ray today, you will receive an invoice from [facility]. Please contact [facility] at [phone] with questions or concerns regarding your invoice.  ° °IF you received labwork today, you will receive an invoice from [facility]. Please contact [facility] at [phone] with questions or concerns regarding your invoice.  ° °Our billing staff will not be able to assist you with questions regarding bills from these companies. ° °You will be contacted with the lab results as soon as they are available. The fastest way to get your results is to activate your My Chart account. Instructions are located on the last page of this paperwork. If you have not heard from us regarding the results in 2 weeks, please contact this office. °  ° ° ° °

## 2019-07-02 DIAGNOSIS — L732 Hidradenitis suppurativa: Secondary | ICD-10-CM | POA: Diagnosis not present

## 2019-09-25 DIAGNOSIS — E559 Vitamin D deficiency, unspecified: Secondary | ICD-10-CM | POA: Diagnosis not present

## 2019-09-25 DIAGNOSIS — Z1329 Encounter for screening for other suspected endocrine disorder: Secondary | ICD-10-CM | POA: Diagnosis not present

## 2019-09-25 DIAGNOSIS — Z6837 Body mass index (BMI) 37.0-37.9, adult: Secondary | ICD-10-CM | POA: Diagnosis not present

## 2019-09-25 DIAGNOSIS — Z01411 Encounter for gynecological examination (general) (routine) with abnormal findings: Secondary | ICD-10-CM | POA: Diagnosis not present

## 2019-09-25 DIAGNOSIS — Z113 Encounter for screening for infections with a predominantly sexual mode of transmission: Secondary | ICD-10-CM | POA: Diagnosis not present

## 2019-09-25 DIAGNOSIS — Z124 Encounter for screening for malignant neoplasm of cervix: Secondary | ICD-10-CM | POA: Diagnosis not present

## 2019-11-29 DIAGNOSIS — Z03818 Encounter for observation for suspected exposure to other biological agents ruled out: Secondary | ICD-10-CM | POA: Diagnosis not present

## 2019-11-29 DIAGNOSIS — Z20828 Contact with and (suspected) exposure to other viral communicable diseases: Secondary | ICD-10-CM | POA: Diagnosis not present

## 2019-12-19 DIAGNOSIS — E559 Vitamin D deficiency, unspecified: Secondary | ICD-10-CM | POA: Diagnosis not present

## 2019-12-19 DIAGNOSIS — Z862 Personal history of diseases of the blood and blood-forming organs and certain disorders involving the immune mechanism: Secondary | ICD-10-CM | POA: Diagnosis not present

## 2019-12-19 DIAGNOSIS — Z113 Encounter for screening for infections with a predominantly sexual mode of transmission: Secondary | ICD-10-CM | POA: Diagnosis not present

## 2019-12-19 DIAGNOSIS — N898 Other specified noninflammatory disorders of vagina: Secondary | ICD-10-CM | POA: Diagnosis not present

## 2019-12-19 DIAGNOSIS — N946 Dysmenorrhea, unspecified: Secondary | ICD-10-CM | POA: Diagnosis not present

## 2020-02-19 IMAGING — CT CT PELVIS W/ CM
2 of 3 series · 15 of 46 positions shown, 17 images · IV contrast (omnipaque)
Comparison: None.

CLINICAL DATA: Hx of Hidradenitis Suppurativa; pt has had rectal
pain x 4 days.

EXAM:
CT PELVIS WITH CONTRAST
TECHNIQUE: Multidetector CT imaging of the pelvis was performed using the
standard protocol following the bolus administration of intravenous
contrast.
CONTRAST:  <See Chart> OMNIPAQUE IOHEXOL 300 MG/ML  SOLN

[Series 4: pelvis 2.0 st · axial · 0.98mm/px · z∈[+682,+924]mm · 12 of 139 slices shown, 14 images]
[im 9/139  soft-tissue]
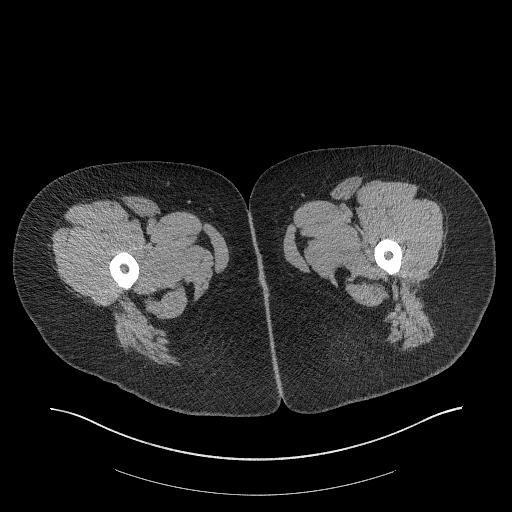
[im 9/139  bone]
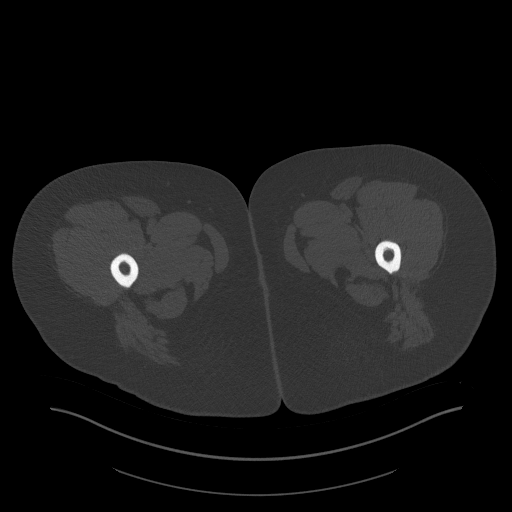
[im 18/139  soft-tissue]
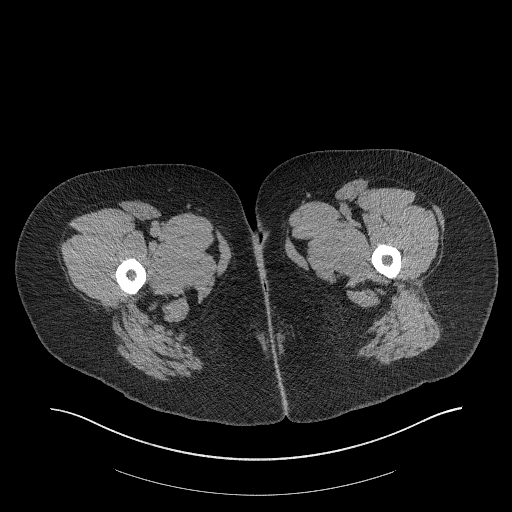
[im 32/139  soft-tissue]
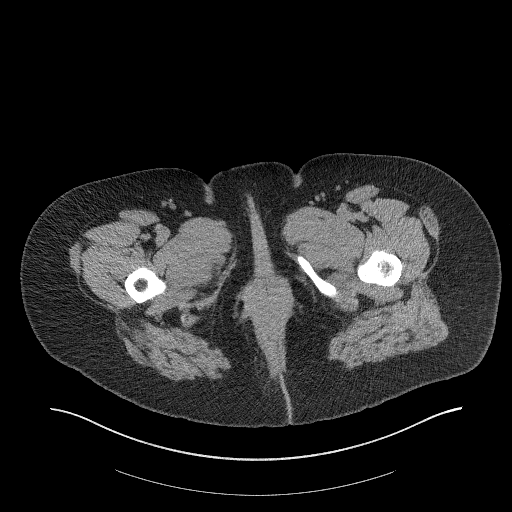
[im 41/139  soft-tissue]
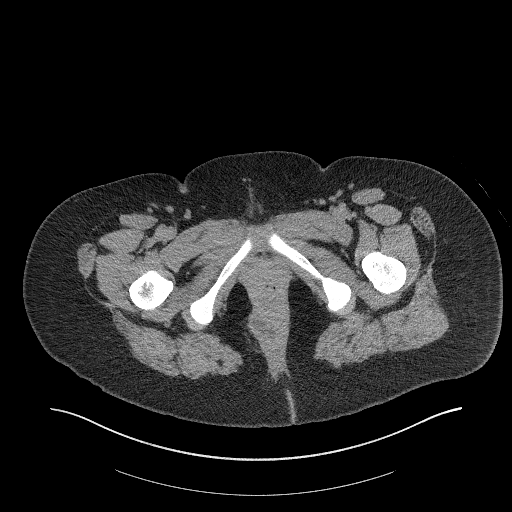
[im 54/139  soft-tissue]
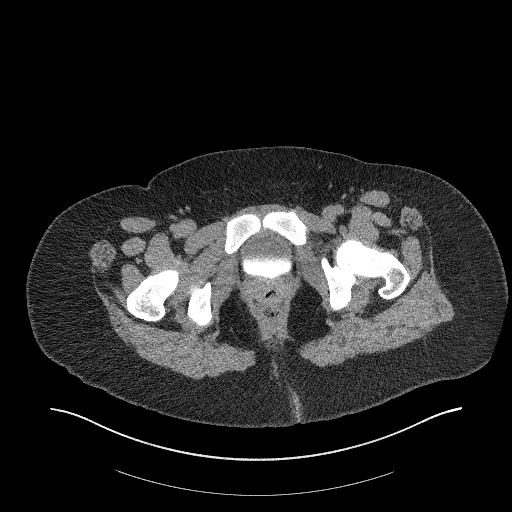
[im 63/139  soft-tissue]
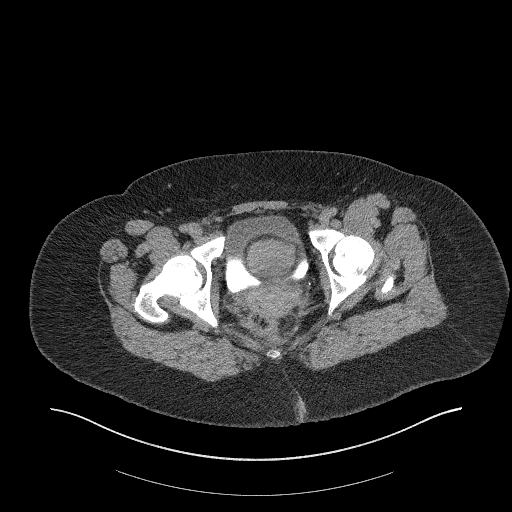
[im 76/139  soft-tissue]
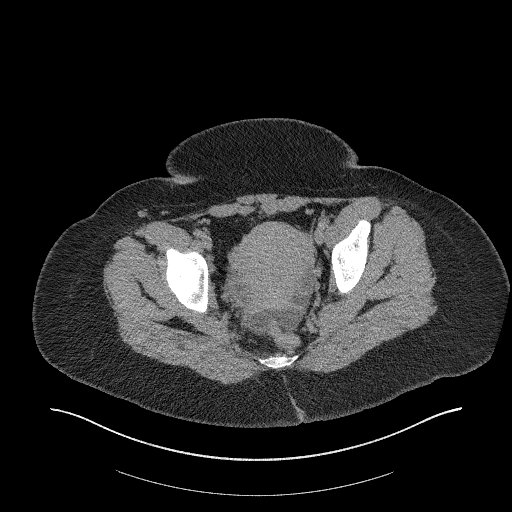
[im 85/139  soft-tissue]
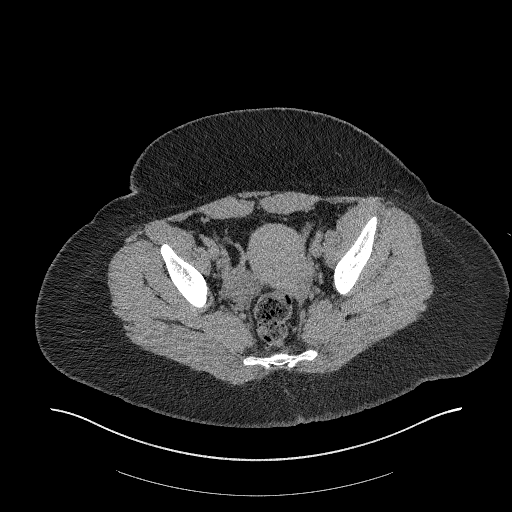
[im 98/139  soft-tissue]
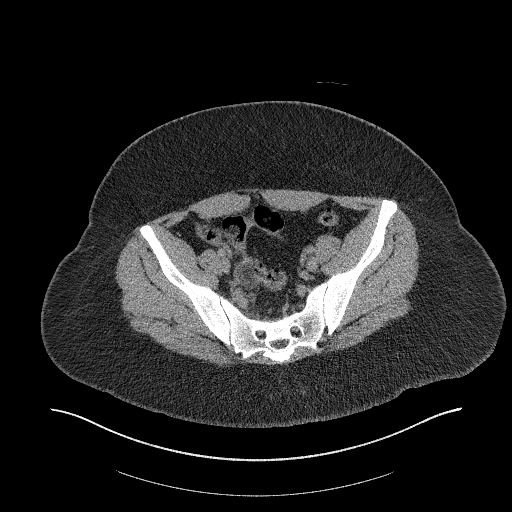
[im 98/139  bone]
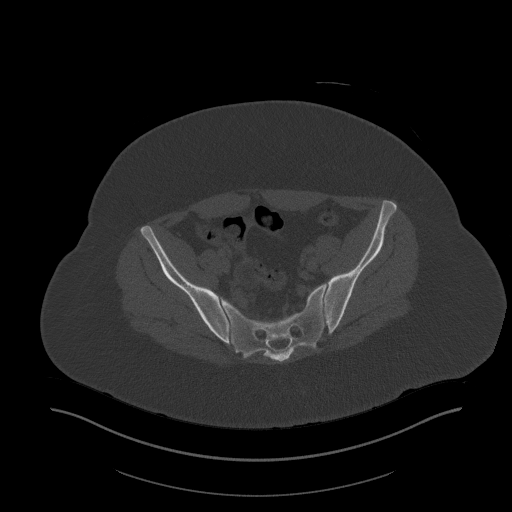
[im 107/139  soft-tissue]
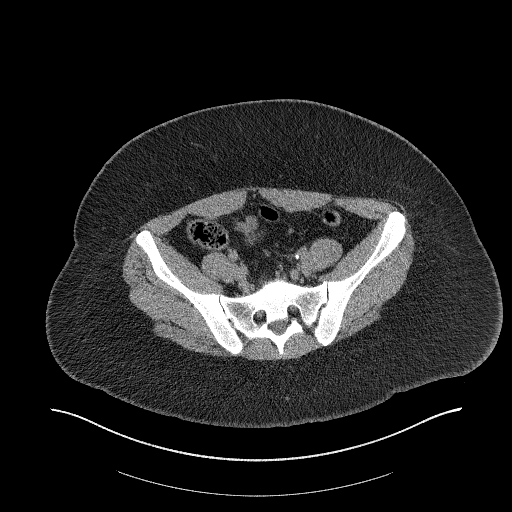
[im 121/139  soft-tissue]
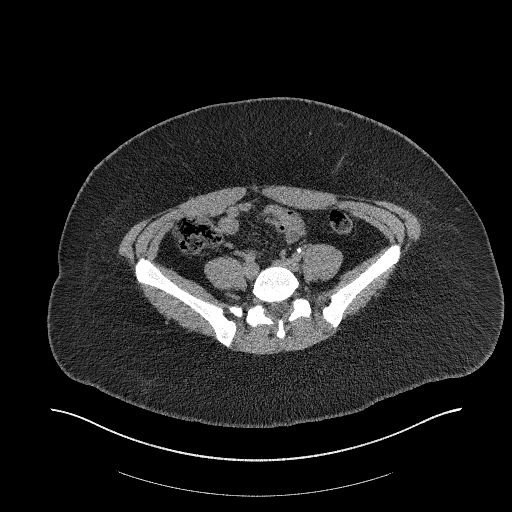
[im 130/139  soft-tissue]
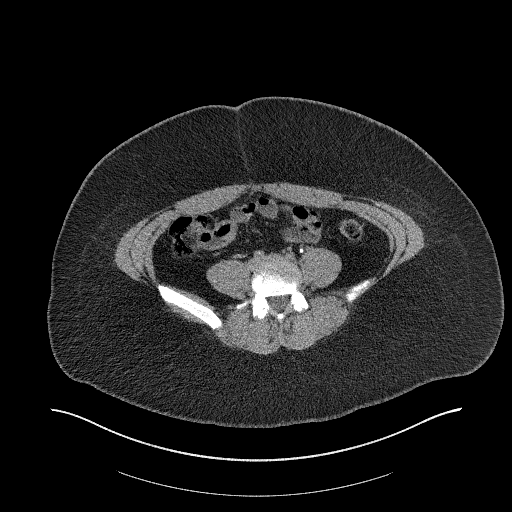

[Series 5: coronal st · coronal · 0.54mm/px · 3 of 191 slices shown]
[im 64/191  soft-tissue]
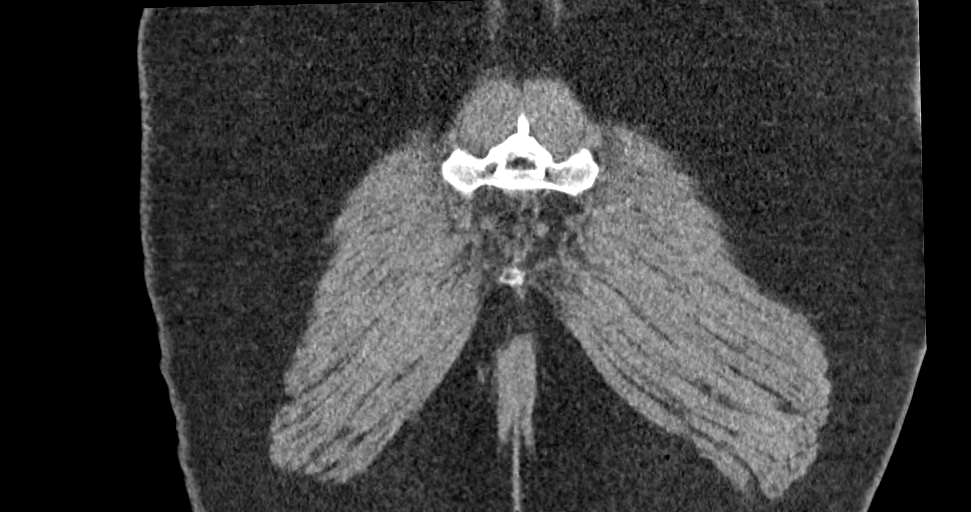
[im 85/191  soft-tissue]
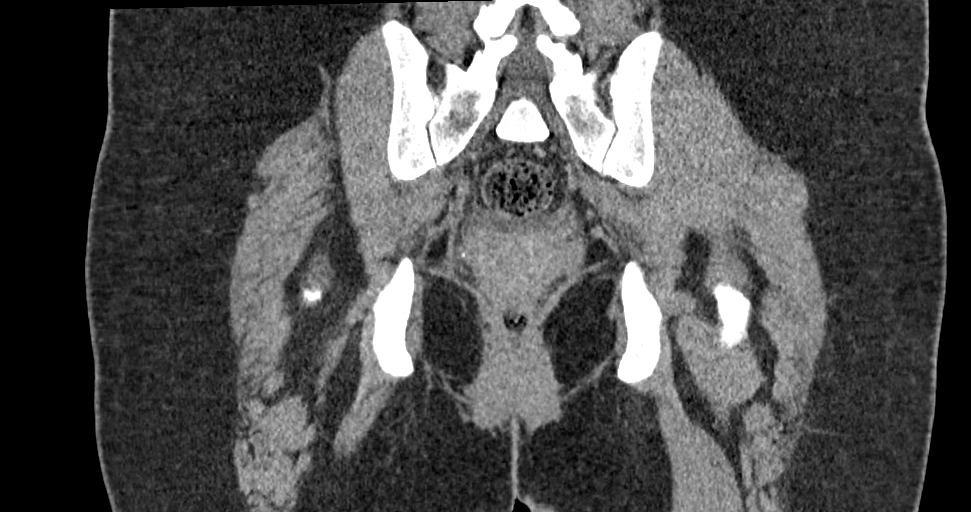
[im 106/191  soft-tissue]
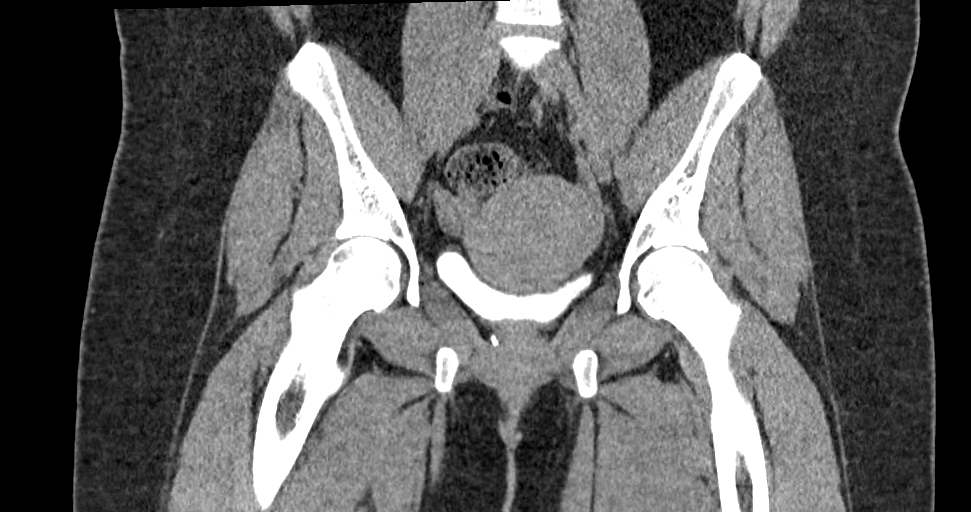

[15 of 46 positions shown; findings below may reference images not displayed]

FINDINGS: Perineum/soft tissues: There is inflammation along the posterior
margin the bones, surrounding a small irregular fluid collection,
which extends from the subcutaneous tissues the right
inferior/medial buttock just below and posterior to the anus,
superiorly to the region of the junction of the rectum and anus,
below the levator sling. The fluid collection measures approximately
3.1 x 1.7 x 3.3 cm.

There is no inflammation fluid collection above the levator sling.

Urinary Tract:  No abnormality visualized.

Bowel: No abnormality of the rectum or visualized small bowel colon.
Normal appendix visualized.

Vascular/Lymphatic: No enlarged lymph nodes. No vascular
abnormality.

Reproductive:  Uterus and adnexa unremarkable.

Other:  None

Musculoskeletal: No skeletal abnormality.
IMPRESSION: 1. Small posterior perianal collection consistent with a perianal
abscess with adjacent inflammation. The abscess and inflammation
lies inferior to the levator sling.
2. No other abnormality.

## 2020-03-04 ENCOUNTER — Emergency Department (HOSPITAL_COMMUNITY)
Admission: EM | Admit: 2020-03-04 | Discharge: 2020-03-04 | Disposition: A | Discharge status: Home / Self Care | Payer: BC Managed Care – PPO | Attending: Emergency Medicine | Admitting: Emergency Medicine

## 2020-03-04 ENCOUNTER — Encounter (HOSPITAL_COMMUNITY): Payer: Self-pay | Admitting: Emergency Medicine

## 2020-03-04 ENCOUNTER — Other Ambulatory Visit: Payer: Self-pay

## 2020-03-04 DIAGNOSIS — R509 Fever, unspecified: Secondary | ICD-10-CM | POA: Diagnosis not present

## 2020-03-04 DIAGNOSIS — U071 COVID-19: Secondary | ICD-10-CM | POA: Insufficient documentation

## 2020-03-04 DIAGNOSIS — F17210 Nicotine dependence, cigarettes, uncomplicated: Secondary | ICD-10-CM | POA: Diagnosis not present

## 2020-03-04 DIAGNOSIS — R52 Pain, unspecified: Secondary | ICD-10-CM

## 2020-03-04 LAB — SARS CORONAVIRUS 2 BY RT PCR (HOSPITAL ORDER, PERFORMED IN ~~LOC~~ HOSPITAL LAB): SARS Coronavirus 2: POSITIVE — AB

## 2020-03-04 MED ORDER — ACETAMINOPHEN 500 MG PO TABS: 1000.0000 mg | ORAL_TABLET | Freq: Once | ORAL | Status: DC

## 2020-03-04 NOTE — ED Provider Notes (Signed)
[facility] EMERGENCY DEPARTMENT Provider Note   CSN: [id number] Arrival date & time: 03/04/20  1600     History Chief Complaint  Patient presents with   Generalized Body Aches    Melanie Townsend is a 29 y.o. female presenting to the emergency department with complaint of generalized body aches and low-grade fever began last night.  She has no other associated symptoms including no cough, congestion, sore throat, ear pain, abdominal pain, urinary symptoms.  She is treated her symptoms with Tylenol with mild relief.  No known Covid contacts.  The history is provided by the patient.       Past Medical History:  Diagnosis Date   Headache associated with hormonal factors    Hidradenitis suppurativa of right axilla 04/2018   PMDD (premenstrual dysphoric disorder)     Patient Active Problem List   Diagnosis Date Noted   No-show for appointment 12/25/2018   Perirectal abscess 07/16/2018   Rectal abscess 07/16/2018   Class 2 obesity due to excess calories without serious comorbidity with body mass index (BMI) of 38.0 to 38.9 in adult 11/10/2017   Hidradenitis axillaris 09/25/2017    Past Surgical History:  Procedure Laterality Date   HYDRADENITIS EXCISION Left 02/26/2018   Procedure: EXCISION HIDRADENITIS TO THE LEFT AXILLA;  Surgeon: Louisa Second, MD;  Location: [facility];  Service: Plastics;  Laterality: Left;   HYDRADENITIS EXCISION Right 04/30/2018   Procedure: EXCISION HIDRADENITIS RIGHT AXILLA;  Surgeon: Louisa Second, MD;  Location: [facility];  Service: Plastics;  Laterality: Right;   PILONIDAL CYST EXCISION     TONSILLECTOMY AND ADENOIDECTOMY Bilateral 03/11/2019   Procedure: TONSILLECTOMY AND ADENOIDECTOMY;  Surgeon: Newman Pies, MD;  Location: [facility];  Service: ENT;  Laterality: Bilateral;     OB History   No obstetric history on file.     Family History  Problem  Relation Age of Onset   Crohn's disease Father    Diabetes Maternal Uncle    Diabetes Maternal Grandmother    Diabetes Paternal Grandmother     Social History   Tobacco Use   Smoking status: Current Some Day Smoker    Packs/day: 0.33    Years: 5.00    Pack years: 1.65    Types: Cigarettes   Smokeless tobacco: Never Used  Building services engineer Use: Never used  Substance Use Topics   Alcohol use: Yes    Comment: occasionally   Drug use: No    Home Medications Prior to Admission medications   Medication Sig Start Date End Date Taking? Authorizing Provider  clindamycin (CLEOCIN) 300 MG capsule TK 1 C PO TID 03/04/19   [provider]  PRESCRIPTION MEDICATION Take 600 mg by mouth See admin instructions. Boric Acid 600 mg capsules - insert one capsule (600 mg) vaginally daily at bedtime for 3 days following end of menstrual cycle    [provider]  PROBIOTIC PRODUCT PO Take by mouth.    [provider]    Allergies    Iodine and Shellfish allergy  Review of Systems   Review of Systems  Constitutional: Positive for fever.  Musculoskeletal: Positive for myalgias (generalized).  All other systems reviewed and are negative.   Physical Exam Updated Vital Signs BP 138/80 (BP Location: Left Arm)    Pulse 96    Temp 99.7 F (37.6 C) (Oral)    Resp 17    Ht 5\' 5"  (1.651 m)  Wt 111.1 kg    SpO2 96%    BMI 40.77 kg/m   Physical Exam Vitals and nursing note reviewed.  Constitutional:      General: She is not in acute distress.    Appearance: She is well-developed. She is not ill-appearing.  HENT:     Head: Normocephalic and atraumatic.     Mouth/Throat:     Mouth: Mucous membranes are moist.     Pharynx: Oropharynx is clear. No oropharyngeal exudate or posterior oropharyngeal erythema.  Eyes:     Conjunctiva/sclera: Conjunctivae normal.  Cardiovascular:     Rate and Rhythm: Normal rate and regular rhythm.  Pulmonary:     Effort:  Pulmonary effort is normal. No respiratory distress.     Breath sounds: Normal breath sounds.  Abdominal:     General: Bowel sounds are normal.     Tenderness: There is no abdominal tenderness.  Musculoskeletal:     Cervical back: Normal range of motion and neck supple. No tenderness.  Lymphadenopathy:     Cervical: No cervical adenopathy.  Neurological:     Mental Status: She is alert.  Psychiatric:        Mood and Affect: Mood normal.        Behavior: Behavior normal.     ED Results / Procedures / Treatments   Labs (all labs ordered are listed, but only abnormal results are displayed) Labs Reviewed  SARS CORONAVIRUS 2 BY RT PCR (HOSPITAL ORDER, PERFORMED IN [facility])    EKG None  Radiology No results found.  Procedures Procedures (including critical care time)  Medications Ordered in ED Medications  acetaminophen (TYLENOL) tablet 1,000 mg (has no administration in time range)    ED Course  I have reviewed the triage vital signs and the nursing notes.  Pertinent labs & imaging results that were available during my care of the patient were reviewed by me and considered in my medical decision making (see chart for details).    MDM Rules/Calculators/A&P                          Patient presenting with 1 day of fever and body aches.  No known Covid exposure, no other associated symptoms.  Heart and lung sounds are clear.  Her vital signs are stable here in the ED.  Will send Covid test, encouraged symptomatic management and outpatient follow-up. Home isolation precautions and return precautions discussed.  Melanie Townsend was evaluated in Emergency Department on 03/04/2020 for the symptoms described in the history of present illness. She was evaluated in the context of the global COVID-19 pandemic, which necessitated consideration that the patient might be at risk for infection with the SARS-CoV-2 virus that causes COVID-19. Institutional protocols  and algorithms that pertain to the evaluation of patients at risk for COVID-19 are in a state of rapid change based on information released by regulatory bodies including the CDC and federal and state organizations. These policies and algorithms were followed during the patient's care in the ED.  Final Clinical Impression(s) / ED Diagnoses Final diagnoses:  Fever, unspecified fever cause  Body aches    Rx / DC Orders ED Discharge Orders    None       Toshia Larkin, [place] N, PA-C 03/04/20 1856    Benjiman Core, MD 03/04/20 2228

## 2020-03-04 NOTE — ED Triage Notes (Addendum)
Patient arrives to ED with complaints of low grade fevers and generalized body aches since yesterday. Patient denies SOB, CP, emesis, dysuria and cough. Pt has hx of Hidradenitis suppurativa and has small cyst on right breast.

## 2020-03-04 NOTE — Discharge Instructions (Signed)
Person Under Monitoring Name: Melanie Townsend  Location: [address] [place] [address]   Infection Prevention Recommendations for Individuals Confirmed to have, or Being Evaluated for, 2019 Novel Coronavirus (COVID-19) Infection Who Receive Care at Home  Individuals who are confirmed to have, or are being evaluated for, COVID-19 should follow the prevention steps below until a healthcare provider or local or state health department says they can return to normal activities.  Stay home except to get medical care You should restrict activities outside your home, except for getting medical care. Do not go to work, school, or public areas, and do not use public transportation or taxis.  Call ahead before visiting your doctor Before your medical appointment, call the healthcare provider and tell them that you have, or are being evaluated for, COVID-19 infection. This will help the healthcare provider's office take steps to keep other people from getting infected. Ask your healthcare provider to call the local or state health department.  Monitor your symptoms Seek prompt medical attention if your illness is worsening (e.g., difficulty breathing). Before going to your medical appointment, call the healthcare provider and tell them that you have, or are being evaluated for, COVID-19 infection. Ask your healthcare provider to call the local or state health department.  Wear a facemask You should wear a facemask that covers your nose and mouth when you are in the same room with other people and when you visit a healthcare provider. People who live with or visit you should also wear a facemask while they are in the same room with you.  Separate yourself from other people in your home As much as possible, you should stay in a different room from other people in your home. Also, you should use a separate bathroom, if available.  Avoid sharing household  items You should not share dishes, drinking glasses, cups, eating utensils, towels, bedding, or other items with other people in your home. After using these items, you should wash them thoroughly with soap and water.  Cover your coughs and sneezes Cover your mouth and nose with a tissue when you cough or sneeze, or you can cough or sneeze into your sleeve. Throw used tissues in a lined trash can, and immediately wash your hands with soap and water for at least 20 seconds or use an alcohol-based hand rub.  Wash your [employer] your hands often and thoroughly with soap and water for at least 20 seconds. You can use an alcohol-based hand sanitizer if soap and water are not available and if your hands are not visibly dirty. Avoid touching your eyes, nose, and mouth with unwashed hands.   Prevention Steps for Caregivers and Household Members of Individuals Confirmed to have, or Being Evaluated for, COVID-19 Infection Being Cared for in the Home  If you live with, or provide care at home for, a person confirmed to have, or being evaluated for, COVID-19 infection please follow these guidelines to prevent infection:  Follow healthcare provider's instructions Make sure that you understand and can help the patient follow any healthcare provider instructions for all care.  Provide for the patient's basic needs You should help the patient with basic needs in the home and provide support for getting groceries, prescriptions, and other personal needs.  Monitor the patient's symptoms If they are getting sicker, call his or her medical provider and tell them that the patient has, or is being evaluated for, COVID-19 infection. This will help the healthcare  provider's office take steps to keep other people from getting infected. Ask the healthcare provider to call the local or state health department.  Limit the number of people who have contact with the patient If possible, have only one caregiver  for the patient. Other household members should stay in another home or place of residence. If this is not possible, they should stay in another room, or be separated from the patient as much as possible. Use a separate bathroom, if available. Restrict visitors who do not have an essential need to be in the home.  Keep older adults, very young children, and other sick people away from the patient Keep older adults, very young children, and those who have compromised immune systems or chronic health conditions away from the patient. This includes people with chronic heart, lung, or kidney conditions, diabetes, and cancer.  Ensure good ventilation Make sure that shared spaces in the home have good air flow, such as from an air conditioner or an opened window, weather permitting.  Wash your hands often Wash your hands often and thoroughly with soap and water for at least 20 seconds. You can use an alcohol based hand sanitizer if soap and water are not available and if your hands are not visibly dirty. Avoid touching your eyes, nose, and mouth with unwashed hands. Use disposable paper towels to dry your hands. If not available, use dedicated cloth towels and replace them when they become wet.  Wear a facemask and gloves Wear a disposable facemask at all times in the room and gloves when you touch or have contact with the patient's blood, body fluids, and/or secretions or excretions, such as sweat, saliva, sputum, nasal mucus, vomit, urine, or feces.  Ensure the mask fits over your nose and mouth tightly, and do not touch it during use. Throw out disposable facemasks and gloves after using them. Do not reuse. Wash your hands immediately after removing your facemask and gloves. If your personal clothing becomes contaminated, carefully remove clothing and launder. Wash your hands after handling contaminated clothing. Place all used disposable facemasks, gloves, and other waste in a lined container  before disposing them with other household waste. Remove gloves and wash your hands immediately after handling these items.  Do not share dishes, glasses, or other household items with the patient Avoid sharing household items. You should not share dishes, drinking glasses, cups, eating utensils, towels, bedding, or other items with a patient who is confirmed to have, or being evaluated for, COVID-19 infection. After the person uses these items, you should wash them thoroughly with soap and water.  Wash laundry thoroughly Immediately remove and wash clothes or bedding that have blood, body fluids, and/or secretions or excretions, such as sweat, saliva, sputum, nasal mucus, vomit, urine, or feces, on them. Wear gloves when handling laundry from the patient. Read and follow directions on labels of laundry or clothing items and detergent. In general, wash and dry with the warmest temperatures recommended on the label.  Clean all areas the individual has used often Clean all touchable surfaces, such as counters, tabletops, doorknobs, bathroom fixtures, toilets, phones, keyboards, tablets, and bedside tables, every day. Also, clean any surfaces that may have blood, body fluids, and/or secretions or excretions on them. Wear gloves when cleaning surfaces the patient has come in contact with. Use a diluted bleach solution (e.g., dilute bleach with 1 part bleach and 10 parts water) or a household disinfectant with a label that says EPA-registered for coronaviruses. To make  a bleach solution at home, add 1 tablespoon of bleach to 1 quart (4 cups) of water. For a larger supply, add  cup of bleach to 1 gallon (16 cups) of water. Read labels of cleaning products and follow recommendations provided on product labels. Labels contain instructions for safe and effective use of the cleaning product including precautions you should take when applying the product, such as wearing gloves or eye protection and making  sure you have good ventilation during use of the product. Remove gloves and wash hands immediately after cleaning.  Monitor yourself for signs and symptoms of illness Caregivers and household members are considered close contacts, should monitor their health, and will be asked to limit movement outside of the home to the extent possible. Follow the monitoring steps for close contacts listed on the symptom monitoring form.   ? If you have additional questions, contact your local health department or call the epidemiologist on call at [phone] (available 24/7). ? This guidance is subject to change. For the most up-to-date guidance from [facility], please refer to their website: [email or web address].[email or web address]

## 2020-03-05 ENCOUNTER — Telehealth: Payer: Self-pay | Admitting: Nurse Practitioner

## 2020-03-05 ENCOUNTER — Other Ambulatory Visit: Payer: Self-pay | Admitting: Physician Assistant

## 2020-03-05 DIAGNOSIS — E6609 Other obesity due to excess calories: Secondary | ICD-10-CM

## 2020-03-05 DIAGNOSIS — U071 COVID-19: Secondary | ICD-10-CM

## 2020-03-05 NOTE — Telephone Encounter (Signed)
Called to discuss with Melanie Townsend about Covid symptoms and the use of casirivimab/imdevimab, a combination monoclonal antibody infusion for those with mild to moderate Covid symptoms and at a high risk of hospitalization.     Pt is qualified for this infusion at the [facility] infusion center due to co-morbid conditions (BMI >25).   Unable to reach. Voicemail left and Mychart message sent.   Patient Active Problem List   Diagnosis Date Noted  . No-show for appointment 12/25/2018  . Perirectal abscess 07/16/2018  . Rectal abscess 07/16/2018  . Class 2 obesity due to excess calories without serious comorbidity with body mass index (BMI) of 38.0 to 38.9 in adult 11/10/2017  . Hidradenitis axillaris 09/25/2017    Willette Alma, AGPCNP-BC

## 2020-03-05 NOTE — Progress Notes (Signed)
I connected by phone with Melanie Townsend on 03/05/2020 at 3:23 PM to discuss the potential use of a new treatment for mild to moderate COVID-19 viral infection in non-hospitalized patients.  This patient is a 29 y.o. female that meets the FDA criteria for Emergency Use Authorization of COVID monoclonal antibody casirivimab/imdevimab.  Has a (+) direct SARS-CoV-2 viral test result  Has mild or moderate COVID-19   Is NOT hospitalized due to COVID-19  Is within 10 days of symptom onset  Has at least one of the high risk factor(s) for progression to severe COVID-19 and/or hospitalization as defined in EUA.  Specific high risk criteria : BMI > 25   I have spoken and communicated the following to the patient or parent/caregiver regarding COVID monoclonal antibody treatment:  1. FDA has authorized the emergency use for the treatment of mild to moderate COVID-19 in adults and pediatric patients with positive results of direct SARS-CoV-2 viral testing who are 9 years of age and older weighing at least 40 kg, and who are at high risk for progressing to severe COVID-19 and/or hospitalization.  2. The significant known and potential risks and benefits of COVID monoclonal antibody, and the extent to which such potential risks and benefits are unknown.  3. Information on available alternative treatments and the risks and benefits of those alternatives, including clinical trials.  4. Patients treated with COVID monoclonal antibody should continue to self-isolate and use infection control measures (e.g., wear mask, isolate, social distance, avoid sharing personal items, clean and disinfect "high touch" surfaces, and frequent handwashing) according to CDC guidelines.   5. The patient or parent/caregiver has the option to accept or refuse COVID monoclonal antibody treatment.  After reviewing this information with the patient, The patient agreed to proceed with receiving casirivimab\imdevimab  infusion and will be provided a copy of the Fact sheet prior to receiving the infusion.   Sx onset 7/21. Set up for infusion Saturday 03/07/20 @ 8:30am. Discussed infusion charge to her insurance.   Cline Crock 03/05/2020 3:23 PM

## 2020-03-06 MED ORDER — SODIUM CHLORIDE 0.9 % IV SOLN
Freq: Once | INTRAVENOUS | Status: AC
  Filled 2020-03-06: qty 600

## 2020-03-07 ENCOUNTER — Other Ambulatory Visit: Payer: Self-pay | Admitting: Emergency Medicine

## 2020-03-07 ENCOUNTER — Ambulatory Visit (HOSPITAL_COMMUNITY)
Admission: RE | Admit: 2020-03-07 | Discharge: 2020-03-07 | Disposition: A | Discharge status: Home / Self Care | Payer: BC Managed Care – PPO | Source: Ambulatory Visit | Attending: Pulmonary Disease | Admitting: Pulmonary Disease

## 2020-03-07 DIAGNOSIS — Z6838 Body mass index (BMI) 38.0-38.9, adult: Secondary | ICD-10-CM | POA: Insufficient documentation

## 2020-03-07 DIAGNOSIS — Z23 Encounter for immunization: Secondary | ICD-10-CM | POA: Insufficient documentation

## 2020-03-07 DIAGNOSIS — U071 COVID-19: Secondary | ICD-10-CM | POA: Insufficient documentation

## 2020-03-07 DIAGNOSIS — E6609 Other obesity due to excess calories: Secondary | ICD-10-CM

## 2020-03-07 MED ORDER — FAMOTIDINE IN NACL 20-0.9 MG/50ML-% IV SOLN: 20.0000 mg | Freq: Once | INTRAVENOUS | Status: DC | PRN

## 2020-03-07 MED ORDER — SODIUM CHLORIDE 0.9 % IV SOLN: INTRAVENOUS | Status: DC | PRN

## 2020-03-07 MED ORDER — ALBUTEROL SULFATE HFA 108 (90 BASE) MCG/ACT IN AERS: 2.0000 | INHALATION_SPRAY | Freq: Once | RESPIRATORY_TRACT | Status: DC | PRN

## 2020-03-07 MED ORDER — EPINEPHRINE 0.3 MG/0.3ML IJ SOAJ: 0.3000 mg | Freq: Once | INTRAMUSCULAR | Status: DC | PRN

## 2020-03-07 MED ORDER — ACYCLOVIR 200 MG PO CAPS: 200.0000 mg | ORAL_CAPSULE | Freq: Three times a day (TID) | ORAL | 0 refills | Status: AC

## 2020-03-07 MED ORDER — METHYLPREDNISOLONE SODIUM SUCC 125 MG IJ SOLR: 125.0000 mg | Freq: Once | INTRAMUSCULAR | Status: DC | PRN

## 2020-03-07 MED ORDER — DIPHENHYDRAMINE HCL 50 MG/ML IJ SOLN: 50.0000 mg | Freq: Once | INTRAMUSCULAR | Status: DC | PRN

## 2020-03-07 NOTE — Discharge Instructions (Signed)

## 2020-03-07 NOTE — Progress Notes (Signed)
  Diagnosis: COVID-19  Physician:Dr Wright   Procedure: Covid Infusion Clinic Med: casirivimab\imdevimab infusion - Provided patient with casirivimab\imdevimab fact sheet for patients, parents and caregivers prior to infusion.  Complications: No immediate complications noted.  Discharge: Discharged home   Tristy Udovich W 03/07/2020  

## 2020-03-09 ENCOUNTER — Telehealth: Payer: Self-pay

## 2020-03-09 NOTE — Telephone Encounter (Signed)
Pt. Called requesting acyclovir be perscribed. Pt. Was diagnosed with covid on 7/21 and a cold sore has popped up recently.

## 2020-03-10 NOTE — Telephone Encounter (Signed)
Filled 03/10/2020

## 2020-06-08 DIAGNOSIS — Z113 Encounter for screening for infections with a predominantly sexual mode of transmission: Secondary | ICD-10-CM | POA: Diagnosis not present

## 2020-06-08 DIAGNOSIS — Z124 Encounter for screening for malignant neoplasm of cervix: Secondary | ICD-10-CM | POA: Diagnosis not present

## 2020-06-08 DIAGNOSIS — N898 Other specified noninflammatory disorders of vagina: Secondary | ICD-10-CM | POA: Diagnosis not present

## 2020-06-08 DIAGNOSIS — Z01419 Encounter for gynecological examination (general) (routine) without abnormal findings: Secondary | ICD-10-CM | POA: Diagnosis not present

## 2020-07-29 DIAGNOSIS — N611 Abscess of the breast and nipple: Secondary | ICD-10-CM | POA: Diagnosis not present

## 2020-07-30 ENCOUNTER — Telehealth: Payer: BC Managed Care – PPO | Admitting: Emergency Medicine

## 2020-07-30 DIAGNOSIS — F339 Major depressive disorder, recurrent, unspecified: Secondary | ICD-10-CM | POA: Diagnosis not present

## 2020-09-23 ENCOUNTER — Encounter: Payer: BC Managed Care – PPO | Admitting: Registered Nurse

## 2023-10-25 DIAGNOSIS — L219 Seborrheic dermatitis, unspecified: Secondary | ICD-10-CM | POA: Insufficient documentation

## 2023-10-25 DIAGNOSIS — D649 Anemia, unspecified: Secondary | ICD-10-CM | POA: Insufficient documentation

## 2023-10-25 DIAGNOSIS — R5383 Other fatigue: Secondary | ICD-10-CM | POA: Insufficient documentation

## 2023-10-25 DIAGNOSIS — D509 Iron deficiency anemia, unspecified: Secondary | ICD-10-CM | POA: Insufficient documentation

## 2023-12-05 DIAGNOSIS — B9689 Other specified bacterial agents as the cause of diseases classified elsewhere: Secondary | ICD-10-CM | POA: Insufficient documentation

## 2024-01-30 DIAGNOSIS — K581 Irritable bowel syndrome with constipation: Secondary | ICD-10-CM | POA: Insufficient documentation

## 2024-04-19 ENCOUNTER — Encounter: Payer: Self-pay | Admitting: Nurse Practitioner

## 2024-07-02 ENCOUNTER — Ambulatory Visit: Payer: Self-pay

## 2024-07-02 NOTE — Telephone Encounter (Signed)
 FYI Only or Action Required?: FYI only for provider: appointment scheduled on 07/03/24.  Patient was last seen in primary care on No longer established with PCP.  Called Nurse Triage reporting Abscess.  Symptoms began several days ago.  Interventions attempted: Nothing.  Symptoms are: gradually worsening.  Triage Disposition: See Physician Within 24 Hours  Patient/caregiver understands and will follow disposition?: Yes  Summary: Popped cyst, seeking appt today   Reason for Triage: pt called reporting that she has a popped abscess that she wants an appt  Best contact: [phone]         Reason for Disposition  [1] Boil > 1/2 inch across (> 12 mm; larger than a marble) AND [2] center is soft or pus colored  Answer Assessment - Initial Assessment Questions Pt reports onset of abscess in lower belly crease on the left that popped today. Reports yellow green bloody drainage. Denies fever and reports moderate pain , improved after popping. Reports history of HS (hidradentitis suppurativa) and previous abscesses. No longer established with PCP as she passed away. Scheduled appt to establish care tomorrow and to address abscess. Advised UC or ED for worsening symptoms.  1. APPEARANCE of BOIL: What does the boil look like?      White lump. Popped today with yellow green bloody drainage   2. LOCATION: Where is the boil located?      Lower belly crease on the left  3. NUMBER: How many boils are there?      One  4. SIZE: How big is the boil? (e.g., inches, cm; compare to size of a coin or other object)     Nickel  5. ONSET: When did the boil start?     Friday  6. PAIN: Is there any pain? If Yes, ask: How bad is the pain?   (Scale 1-10; or mild, moderate, severe)     Moderate after being popped  7. FEVER: Do you have a fever? If Yes, ask: What is it, how was it measured, and when did it start?      Fever  8. SOURCE: Have you been around anyone with boils or  other Staph infections? Have you ever had boils before?     Has had abscesses in the past. Has history of HS (hidradentitis suppurativa).  9. OTHER SYMPTOMS: Do you have any other symptoms? (e.g., shaking chills, weakness, rash elsewhere on body)     Denies  10. PREGNANCY: Is there any chance you are pregnant? When was your last menstrual period?       Denies. LMP 06/22/24  Protocols used: Boil (Skin Abscess)-A-AH

## 2024-07-03 ENCOUNTER — Encounter: Payer: Self-pay | Admitting: Family Medicine

## 2024-07-03 ENCOUNTER — Ambulatory Visit (INDEPENDENT_AMBULATORY_CARE_PROVIDER_SITE_OTHER): Admitting: Family Medicine

## 2024-07-03 VITALS — BP 128/87 | HR 91 | Ht 65.0 in | Wt 281.0 lb

## 2024-07-03 DIAGNOSIS — Z7689 Persons encountering health services in other specified circumstances: Secondary | ICD-10-CM

## 2024-07-03 DIAGNOSIS — L732 Hidradenitis suppurativa: Secondary | ICD-10-CM

## 2024-07-03 DIAGNOSIS — F332 Major depressive disorder, recurrent severe without psychotic features: Secondary | ICD-10-CM | POA: Diagnosis not present

## 2024-07-03 MED ORDER — CLINDAMYCIN PHOSPHATE 1 % EX LOTN: TOPICAL_LOTION | Freq: Two times a day (BID) | CUTANEOUS | 1 refills | Status: AC

## 2024-07-03 NOTE — Patient Instructions (Signed)
[facility] [address]  [place], [place]  No appointment necessary  Open 24/7.   

## 2024-07-03 NOTE — Assessment & Plan Note (Signed)
 PHQ-9 score: 27 Has thoughts daily of self harm. Contracts for safety. [facility] information provided. Understands if thoughts change, she should go to the ED or [facility] for immediate mental health assessment.  Urgent referral to psychiatry.

## 2024-07-03 NOTE — Progress Notes (Signed)
 New Patient Office Visit  Subjective    Patient ID: Melanie Townsend, female    DOB: [birth year]  Age: 33 y.o. MRN: [id number]  CC:  Chief Complaint  Patient presents with   Establish Care    Abscess left side -pantie line - popped on its own Pt would like a referral for dermatology  Gets reoccurring abscesses - has been thinking about working from home for a while was wondering what can be done     HPI Melanie Townsend presents to establish care with this practice. She is new to me. Previous PCP has passed away.   Recent abscess on left panty line that spontaneously popped at work.  This is embarrassing for her due to smell of pus, etc.  Hx of HS. Recurring of recurring cyst. Has seen derm in the past for Humira injections. Will place referral today. Cleocin  1% lotion BID while awaiting derm Discussed weight loss to improve health of her skin.   Depression: PHQ-9 score: 27 Has thoughts daily of self harm. Contracts for safety. [facility] information provided. Urgent referral to psychiatry. [employer] begins in January.   Work from home: Will inquire with employer what is required and schedule appointment to discuss. Need time to work from home when she has a flare of HS to provide wound care on self.    Outpatient Encounter Medications as of 07/03/2024  Medication Sig   clindamycin  (CLEOCIN -T) 1 % lotion Apply topically 2 (two) times daily.   ferrous sulfate (FEROSUL) 325 (65 FE) MG tablet Take 325 mg by mouth daily. (Patient taking differently: Take 325 mg by mouth daily.)   PROBIOTIC PRODUCT PO Take by mouth.   PRESCRIPTION MEDICATION Take 600 mg by mouth See admin instructions. Boric Acid 600 mg capsules - insert one capsule (600 mg) vaginally daily at bedtime for 3 days following end of menstrual cycle   [DISCONTINUED] clindamycin  (CLEOCIN ) 300 MG capsule TK 1 C PO TID   No facility-administered encounter medications on file as of 07/03/2024.     Past Medical History:  Diagnosis Date   Headache associated with hormonal factors    Hidradenitis suppurativa of right axilla 04/2018   PMDD (premenstrual dysphoric disorder)     Past Surgical History:  Procedure Laterality Date   HYDRADENITIS EXCISION Left 02/26/2018   Procedure: EXCISION HIDRADENITIS TO THE LEFT AXILLA;  Surgeon: Marcus Lung, MD;  Location: [facility];  Service: Plastics;  Laterality: Left;   HYDRADENITIS EXCISION Right 04/30/2018   Procedure: EXCISION HIDRADENITIS RIGHT AXILLA;  Surgeon: Marcus Lung, MD;  Location: [facility];  Service: Plastics;  Laterality: Right;   PILONIDAL CYST EXCISION     TONSILLECTOMY AND ADENOIDECTOMY Bilateral 03/11/2019   Procedure: TONSILLECTOMY AND ADENOIDECTOMY;  Surgeon: Karis Clunes, MD;  Location: [facility];  Service: ENT;  Laterality: Bilateral;    Family History  Problem Relation Age of Onset   Crohn's disease Father    Diabetes Maternal Uncle    Diabetes Maternal Grandmother    Diabetes Paternal Grandmother     Social History   Socioeconomic History   Marital status: Single    Spouse name: Not on file   Number of children: Not on file   Years of education: Not on file   Highest education level: Not on file  Occupational History   Not on file  Tobacco Use   Smoking status: Some Days    Current packs/day: 0.33    Average packs/day: 0.3 packs/day for  5.0 years (1.7 ttl pk-yrs)    Types: Cigarettes   Smokeless tobacco: Never  Vaping Use   Vaping status: Never Used  Substance and Sexual Activity   Alcohol use: Yes    Comment: occasionally   Drug use: No   Sexual activity: Not on file  Other Topics Concern   Not on file  Social History Narrative   Not on file   Social Drivers of Health   Financial Resource Strain: Not on file  Food Insecurity: No Food Insecurity (11/22/2021)   Received from [facility]   Hunger Vital Sign    Within the past 12  months, you worried that your food would run out before you got the money to buy more.: Never true    Within the past 12 months, the food you bought just didn't last and you didn't have money to get more.: Never true  Transportation Needs: Not on file  Physical Activity: Not on file  Stress: Not on file  Social Connections: Unknown (12/27/2021)   Received from [facility]   Social Network    Social Network: Not on file  Intimate Partner Violence: Unknown (11/18/2021)   Received from [facility]   HITS    Physically Hurt: Not on file    Insult or Talk Down To: Not on file    Threaten Physical Harm: Not on file    Scream or Curse: Not on file    ROS      Objective    BP 128/87 (BP Location: Right Arm, Patient Position: Sitting, Cuff Size: Large)   Pulse 91   Ht 5' 5 (1.651 m)   Wt 281 lb (127.5 kg)   SpO2 100%   BMI 46.76 kg/m   Physical Exam Vitals and nursing note reviewed.  Constitutional:      General: She is not in acute distress.    Appearance: Normal appearance.  Cardiovascular:     Rate and Rhythm: Normal rate and regular rhythm.     Heart sounds: Normal heart sounds.  Pulmonary:     Effort: Pulmonary effort is normal.     Breath sounds: Normal breath sounds.  Skin:    General: Skin is warm and dry.  Neurological:     General: No focal deficit present.     Mental Status: She is alert. Mental status is at baseline.  Psychiatric:        Mood and Affect: Mood normal.        Behavior: Behavior normal.        Thought Content: Thought content normal.        Judgment: Judgment normal.           07/03/2024    1:53 PM 06/26/2019    9:31 AM 11/23/2018   12:29 PM 11/06/2018    3:06 PM 10/17/2018    9:03 AM  Depression screen PHQ 2/9  Decreased Interest 3 2 0 3 3  Down, Depressed, Hopeless 3 2 0 3 3  PHQ - 2 Score 6 4 0 6 6  Altered sleeping 3 2   3   Tired, decreased energy 3 2   3   Change in appetite 3 3   3   Feeling bad or failure about yourself  3 1    3   Trouble concentrating 3 1   3   Moving slowly or fidgety/restless 3 1   0  Suicidal thoughts 3 1   1   PHQ-9 Score 27 15    22    Difficult  doing work/chores Extremely dIfficult Extremely dIfficult        Data saved with a previous flowsheet row definition  Contracts for safety. Referral to psychiatry. [facility] information provided.      07/03/2024    1:54 PM  GAD 7 : Generalized Anxiety Score  Nervous, Anxious, on Edge 3  Control/stop worrying 3  Worry too much - different things 3  Trouble relaxing 3  Restless 3  Easily annoyed or irritable 3  Afraid - awful might happen 3  Total GAD 7 Score 21  Anxiety Difficulty Extremely difficult       Assessment & Plan:   Problem List Items Addressed This Visit     Hidradenitis axillaris   History of recurring cyst. Recent rupture while at work caused her embarrassment. She is interested in work accommodations that allow her to work from home when she is having a flare.  Has seen derm in the past for Humira injections. Cleocin  1% lotion BID while awaiting derm Will place referral today.        Relevant Medications   clindamycin  (CLEOCIN -T) 1 % lotion   Other Relevant Orders   Ambulatory referral to Dermatology   Establishing care with new doctor, encounter for - Primary   Severe episode of recurrent major depressive disorder, without psychotic features (HCC)   PHQ-9 score: 27 Has thoughts daily of self harm. Contracts for safety. [facility] information provided. Understands if thoughts change, she should go to the ED or [facility] for immediate mental health assessment.  Urgent referral to psychiatry.      Relevant Orders   Ambulatory referral to Psychiatry  Agrees with plan of care discussed.  Questions answered.   Return in about 2 weeks (around 07/17/2024) for CPE with labs.   Darice JONELLE Brownie, FNP

## 2024-07-03 NOTE — Assessment & Plan Note (Addendum)
 History of recurring cyst. Recent rupture while at work caused her embarrassment. She is interested in work accommodations that allow her to work from home when she is having a flare.  Has seen derm in the past for Humira injections. Cleocin  1% lotion BID while awaiting derm Will place referral today.

## 2024-07-05 ENCOUNTER — Other Ambulatory Visit: Payer: Self-pay | Admitting: Family Medicine

## 2024-07-05 ENCOUNTER — Encounter: Payer: Self-pay | Admitting: Family Medicine

## 2024-07-05 DIAGNOSIS — L219 Seborrheic dermatitis, unspecified: Secondary | ICD-10-CM

## 2024-07-05 MED ORDER — BETAMETHASONE DIPROPIONATE 0.05 % EX LOTN: TOPICAL_LOTION | Freq: Every day | CUTANEOUS | 0 refills | Status: DC

## 2024-07-08 ENCOUNTER — Other Ambulatory Visit: Payer: Self-pay | Admitting: Family Medicine

## 2024-07-08 DIAGNOSIS — L219 Seborrheic dermatitis, unspecified: Secondary | ICD-10-CM

## 2024-07-08 MED ORDER — TRIAMCINOLONE ACETONIDE 0.1 % EX LOTN
1.0000 | TOPICAL_LOTION | Freq: Every day | CUTANEOUS | 1 refills | Status: AC
Start: 2024-07-08 — End: ?

## 2024-07-15 ENCOUNTER — Ambulatory Visit: Admitting: Dermatology

## 2024-07-15 ENCOUNTER — Encounter: Payer: Self-pay | Admitting: Dermatology

## 2024-07-15 VITALS — BP 171/83

## 2024-07-15 DIAGNOSIS — L219 Seborrheic dermatitis, unspecified: Secondary | ICD-10-CM | POA: Diagnosis not present

## 2024-07-15 DIAGNOSIS — Z720 Tobacco use: Secondary | ICD-10-CM | POA: Diagnosis not present

## 2024-07-15 DIAGNOSIS — L732 Hidradenitis suppurativa: Secondary | ICD-10-CM

## 2024-07-15 MED ORDER — CLOBETASOL PROPIONATE 0.05 % EX SOLN: 1.0000 | Freq: Two times a day (BID) | CUTANEOUS | 11 refills | Status: AC

## 2024-07-15 MED ORDER — SPIRONOLACTONE 100 MG PO TABS: 100.0000 mg | ORAL_TABLET | Freq: Every day | ORAL | 5 refills | Status: AC

## 2024-07-15 MED ORDER — DOXYCYCLINE HYCLATE 100 MG PO TABS: 100.0000 mg | ORAL_TABLET | Freq: Two times a day (BID) | ORAL | 5 refills | Status: AC | PRN

## 2024-07-15 MED ORDER — MUPIROCIN 2 % EX OINT: 1.0000 | TOPICAL_OINTMENT | Freq: Two times a day (BID) | CUTANEOUS | 11 refills | Status: AC

## 2024-07-15 NOTE — Progress Notes (Signed)
 New Patient Visit   Subjective  Melanie Townsend is a 33 y.o. female who presents for a NEW PATIENT appointment to be examined for the concerns as listed below.   HS: Not flared today. First Dx around 2009. She typically flares under the breast and groin area. She mentioned that she had surgery in 2019 by Dr. Marcus, plastic surgeon, to prevent axillae flares with no reoccurrence. She stated that she has gained weight and has been experiencing more flares. She is currently using clindamycin  lotion that was Rx from her PCP that she started a week ago. Prior to receiving clindamycin  lotion she was currently doing nothing for her Sx.    SebDerm: She first started having scalp issues in 2020. She is currently using ketoconazole shampoo twice a month and has previously tried betamethasone  solution which helped but she is unable to get now due to insurance formulary changes.    Are you nursing, pregnant or trying to conceive? No   The following portions of the chart were reviewed this encounter and updated as appropriate: medications, allergies, medical history  Review of Systems:  No other skin or systemic complaints except as noted in HPI or Assessment and Plan.  Objective  Well appearing patient in no apparent distress; mood and affect are within normal limits.   A focused examination was performed of the following areas: groin, under breast & scalp   Relevant exam findings are noted in the Assessment and Plan.    Assessment & Plan    Hidradenitis suppurativa, Hurley stage II, inframammary and axillary Early stage II hidradenitis suppurativa with flares under the breast and axillary regions. Previous surgical intervention in 2019 with no recurrence under the arms. Smoking and dietary factors identified as potential triggers. Spironolactone  discussed as a treatment option to block hormone receptors and reduce flares. Benzoyl peroxide wash recommended for topical  management. Doxycycline  prescribed for early intervention of flares. Risks of spironolactone  include potential blood pressure effects and contraindications during pregnancy. Spironolactone  reduces flares from monthly to biannually in some patients.   - Prescribed spironolactone  100 mg once daily at dinnertime. - Advised to monitor for side effects such as lightheadedness, dizziness, or chest fluttering. - Instructed to discontinue spironolactone  if pregnancy occurs. - Recommended benzoyl peroxide wash for daily use on active areas. - Prescribed doxycycline  for early intervention of flares, to be taken with meals for one week as needed. - Scheduled follow-up in six months to assess flare frequency and treatment efficacy.  Seborrheic dermatitis of scalp and face Seborrheic dermatitis primarily affecting the scalp with flakiness and itching. DHS zinc shampoo recommended as a less drying alternative. Clobetasol  prescribed for flare management. Emphasis on regular use of DHS zinc shampoo to reduce flare frequency. Clobetasol  should be used once or twice daily as needed until the next shampoo day. - Recommended DHS zinc shampoo every two weeks, allowing it to sit for 2-3 minutes before rinsing. - Prescribed clobetasol  for use once or twice daily as needed until the next shampoo day. - Provided samples of a heavy-duty moisturizer with copper, zinc peptide for nighttime use.  Tobacco use disorder Current tobacco use identified as a trigger for hidradenitis suppurativa flares. Previous successful cessation with Chantix. Encouraged to quit smoking to reduce inflammation and improve treatment outcomes. - Encouraged smoking cessation using Chantix.   No follow-ups on file.   Documentation: I have reviewed the above documentation for accuracy and completeness, and I agree with the above.  I, Shirron Maranda, CMA II, am  acting as scribe for:   Delon Lenis, DO

## 2024-07-15 NOTE — Patient Instructions (Addendum)
 VISIT SUMMARY:  Today, you were seen for the management of your hidradenitis suppurativa flares and scalp dermatitis. We discussed your history of surgical intervention for hidradenitis suppurativa, current flare triggers, and your scalp care routine. We also addressed your tobacco use and its impact on your condition.  YOUR PLAN:  -HIDRADENITIS SUPPURATIVA:  Hidradenitis suppurativa is a chronic skin condition that causes painful lumps under the skin, often in areas where skin rubs together. We discussed that smoking and your menstrual cycle can trigger flares.   You have been prescribed spironolactone 100 mg once daily at dinnertime to help reduce flares, and you should monitor for side effects like lightheadedness or dizziness. If you become pregnant, stop taking spironolactone immediately. Additionally, use a benzoyl peroxide wash daily on active areas and take doxycycline  with meals for one week as needed for early flare intervention.   We will follow up in six months to assess your progress.  -SEBORRHEIC DERMATITIS:  Seborrheic dermatitis is a common skin condition that causes flakiness and itching, primarily on the scalp.   You have been recommended to use DHS zinc shampoo every two weeks, letting it sit for 2-3 minutes before rinsing.   Clobetasol should be used once or twice daily as needed until your next shampoo day.   You were also provided with samples of a heavy-duty moisturizer with copper and zinc peptide for nighttime use.  -TOBACCO USE DISORDER:  Tobacco use disorder is the dependence on tobacco products. Smoking has been identified as a trigger for your hidradenitis suppurativa flares. You were encouraged to quit smoking and consider using Chantix, which has helped you successfully quit in the past.  INSTRUCTIONS:  Please follow up in six months to assess the frequency of your hidradenitis suppurativa flares and the effectiveness of your treatment plan.       Important Information  Due to recent changes in healthcare laws, you may see results of your pathology and/or laboratory studies on MyChart before the doctors have had a chance to review them. We understand that in some cases there may be results that are confusing or concerning to you. Please understand that not all results are received at the same time and often the doctors may need to interpret multiple results in order to provide you with the best plan of care or course of treatment. Therefore, we ask that you please give us  2 business days to thoroughly review all your results before contacting the office for clarification. Should we see a critical lab result, you will be contacted sooner.   If You Need Anything After Your Visit  If you have any questions or concerns for your doctor, please call our main line at [phone] If no one answers, please leave a voicemail as directed and we will return your call as soon as possible. Messages left after 4 pm will be answered the following business day.   You may also send us  a message via MyChart. We typically respond to MyChart messages within 1-2 business days.  For prescription refills, please ask your pharmacy to contact our office. Our fax number is [phone].  If you have an urgent issue when the clinic is closed that cannot wait until the next business day, you can page your doctor at the number below.    Please note that while we do our best to be available for urgent issues outside of office hours, we are not available 24/7.   If you have an urgent issue and are unable to  reach us , you may choose to seek medical care at your doctor's office, retail clinic, urgent care center, or emergency room.  If you have a medical emergency, please immediately call 911 or go to the emergency department. In the event of inclement weather, please call our main line at [phone] for an update on the status of any delays or  closures.  Dermatology Medication Tips: Please keep the boxes that topical medications come in in order to help keep track of the instructions about where and how to use these. Pharmacies typically print the medication instructions only on the boxes and not directly on the medication tubes.   If your medication is too expensive, please contact our office at [phone] or send us  a message through MyChart.   We are unable to tell what your co-pay for medications will be in advance as this is different depending on your insurance coverage. However, we may be able to find a substitute medication at lower cost or fill out paperwork to get insurance to cover a needed medication.   If a prior authorization is required to get your medication covered by your insurance company, please allow us  1-2 business days to complete this process.  Drug prices often vary depending on where the prescription is filled and some pharmacies may offer cheaper prices.  The website www.goodrx.com contains coupons for medications through different pharmacies. The prices here do not account for what the cost may be with help from insurance (it may be cheaper with your insurance), but the website can give you the price if you did not use any insurance.  - You can print the associated coupon and take it with your prescription to the pharmacy.  - You may also stop by our office during regular business hours and pick up a GoodRx coupon card.  - If you need your prescription sent electronically to a different pharmacy, notify our office through [facility] or by phone at [phone]

## 2024-07-17 ENCOUNTER — Encounter: Payer: Self-pay | Admitting: Physician Assistant

## 2024-07-24 ENCOUNTER — Encounter: Admitting: Family Medicine

## 2024-07-26 ENCOUNTER — Telehealth: Payer: Self-pay | Admitting: Family Medicine

## 2024-07-26 NOTE — Telephone Encounter (Unsigned)
 Copied from CRM #[id number]. Topic: Clinical - Refused Triage >> Jul 26, 2024  9:01 AM Rosaria BRAVO wrote: Patient/caller voiced complaints of Mental health issues, will be seeing her PCP on Monday and states she only wants to discuss it with her. Declined transfer to triage.

## 2024-07-29 ENCOUNTER — Encounter: Payer: Self-pay | Admitting: Family Medicine

## 2024-07-29 ENCOUNTER — Ambulatory Visit: Admitting: Family Medicine

## 2024-07-29 VITALS — BP 112/76 | HR 82 | Temp 98.3°F | Ht 65.0 in | Wt 288.0 lb

## 2024-07-29 DIAGNOSIS — Z130 Encounter for screening for diseases of the blood and blood-forming organs and certain disorders involving the immune mechanism: Secondary | ICD-10-CM | POA: Diagnosis not present

## 2024-07-29 DIAGNOSIS — R140 Abdominal distension (gaseous): Secondary | ICD-10-CM | POA: Insufficient documentation

## 2024-07-29 DIAGNOSIS — N898 Other specified noninflammatory disorders of vagina: Secondary | ICD-10-CM | POA: Diagnosis not present

## 2024-07-29 DIAGNOSIS — Z13220 Encounter for screening for lipoid disorders: Secondary | ICD-10-CM

## 2024-07-29 DIAGNOSIS — Z0000 Encounter for general adult medical examination without abnormal findings: Secondary | ICD-10-CM | POA: Diagnosis not present

## 2024-07-29 DIAGNOSIS — Z136 Encounter for screening for cardiovascular disorders: Secondary | ICD-10-CM

## 2024-07-29 DIAGNOSIS — Z13228 Encounter for screening for other metabolic disorders: Secondary | ICD-10-CM | POA: Diagnosis not present

## 2024-07-29 NOTE — Progress Notes (Signed)
 Complete physical exam  Patient: Melanie Townsend   DOB: 1990-12-24   33 y.o. Female  MRN: [id number]  Subjective:    Chief Complaint  Patient presents with   Annual Exam    REFERRAL - gynecology  Pt would like to talk about previous encounters in MyChart    Melanie Townsend is a 33 y.o. female who presents today for a complete physical exam. She reports consuming a general diet. Walking 1 mile daily.  She generally feels poorly. She reports sleeping fairly well. She does not have additional problems to discuss today.    Most recent fall risk assessment:    06/26/2019    9:31 AM  Fall Risk   Falls in the past year? 0   Number falls in past yr: 0   Injury with Fall? 0   Follow up Falls evaluation completed      Data saved with a previous flowsheet row definition     Most recent depression screenings:    07/29/2024    1:44 PM 07/03/2024    1:53 PM  PHQ 2/9 Scores  PHQ - 2 Score 6 6  PHQ- 9 Score 24 27    Vision:Not within last year  and Dental: No current dental problems and No regular dental care     Patient Care Team: Booker Darice SAUNDERS, FNP as PCP - General (Family Medicine)   Show/hide medication list[1]  ROS        Objective:     BP 112/76 (BP Location: Left Arm, Patient Position: Sitting, Cuff Size: Large)   Pulse 82   Temp 98.3 F (36.8 C) (Oral)   Ht 5' 5 (1.651 m)   Wt 288 lb (130.6 kg)   BMI 47.93 kg/m    Physical Exam Vitals and nursing note reviewed.  Constitutional:      General: She is not in acute distress.    Appearance: Normal appearance.  HENT:     Right Ear: Tympanic membrane normal.     Left Ear: Tympanic membrane normal.     Nose: Nose normal.     Mouth/Throat:     Mouth: Mucous membranes are moist.     Pharynx: Oropharynx is clear.  Eyes:     Extraocular Movements: Extraocular movements intact.  Neck:     Thyroid : No thyroid  tenderness.  Cardiovascular:     Rate and Rhythm: Normal rate and regular  rhythm.     Pulses:          Radial pulses are 2+ on the right side and 2+ on the left side.     Heart sounds: Normal heart sounds, S1 normal and S2 normal.  Pulmonary:     Effort: Pulmonary effort is normal.     Breath sounds: Normal breath sounds.  Abdominal:     General: Bowel sounds are normal.     Palpations: Abdomen is soft.     Tenderness: There is no abdominal tenderness.  Musculoskeletal:        General: Normal range of motion.     Cervical back: Normal range of motion.     Right lower leg: No edema.     Left lower leg: No edema.  Lymphadenopathy:     Cervical:     Right cervical: No superficial cervical adenopathy.    Left cervical: No superficial cervical adenopathy.  Skin:    General: Skin is warm and dry.  Neurological:     General: No focal deficit present.     Mental  Status: She is alert. Mental status is at baseline.  Psychiatric:        Mood and Affect: Mood normal.        Behavior: Behavior normal.        Thought Content: Thought content normal.        Judgment: Judgment normal.      No results found for any visits on 07/29/24.     Assessment & Plan:    Routine Health Maintenance and Physical Exam   There is no immunization history on file for this patient.  Health Maintenance  Topic Date Due   DTaP/Tdap/Td (1 - Tdap) Never done   Pneumococcal Vaccine (1 of 2 - PCV) Never done   Hepatitis B Vaccines 19-59 Average Risk (1 of 3 - 19+ 3-dose series) Never done   HPV VACCINES (1 - 3-dose SCDM series) Never done   Cervical Cancer Screening (HPV/Pap Cotest)  06/09/2023   COVID-19 Vaccine (1 - 2025-26 season) Never done   Influenza Vaccine  11/12/2024 (Originally 03/15/2024)   Hepatitis C Screening  Completed   HIV Screening  Completed   Meningococcal B Vaccine  Aged Out    Discussed health benefits of physical activity, and encouraged her to engage in regular exercise appropriate for her age and condition.  Problem List Items Addressed This Visit      Screening for deficiency anemia - Primary   Relevant Orders   CBC   Vaginal odor   Relevant Orders   Ambulatory referral to Gynecology   Bloated abdomen   Relevant Orders   Ambulatory referral to Gastroenterology    Routine labs ordered.  HCM reviewed/discussed. Up to date with pap smear, records have been requested. Anticipatory guidance regarding healthy weight, lifestyle and choices given. Recommend healthy diet.  Recommend approximately 150 minutes/week of moderate intensity exercise. Resistance training is good for building muscles and for bone health. Muscle mass helps to increase our metabolism and to burn more calories at rest.  Limit alcohol consumption: no more than one drink per day for women and 2 drinks per day for me. Recommend regular dental and vision exams. Always use seatbelt/lap and shoulder restraints. Recommend using smoke alarms and checking batteries at least twice a year. Recommend using sunscreen when outside.  Referral to GYN and GI for evaluation of vaginal odor and abdominal bloating/gas.   Agrees with plan of care discussed.  Questions answered.      Return in about 1 year (around 07/30/2025) for CPE with labs.     Darice JONELLE Brownie, FNP     [1]  Outpatient Medications Prior to Visit  Medication Sig   clindamycin  (CLEOCIN -T) 1 % lotion Apply topically 2 (two) times daily.   clobetasol  (TEMOVATE ) 0.05 % external solution Apply 1 Application topically 2 (two) times daily.   doxycycline  (VIBRA -TABS) 100 MG tablet Take 1 tablet (100 mg total) by mouth 2 (two) times daily as needed. TAKE WITH FOOD. (For flares)   ferrous sulfate  (FEROSUL) 325 (65 FE) MG tablet Take 325 mg by mouth daily.   mupirocin  ointment (BACTROBAN ) 2 % Apply 1 Application topically 2 (two) times daily. Use until clear for flares.   PRESCRIPTION MEDICATION Take 600 mg by mouth See admin instructions. Boric Acid 600 mg capsules - insert one capsule (600 mg) vaginally daily at  bedtime for 3 days following end of menstrual cycle   PROBIOTIC PRODUCT PO Take by mouth.   spironolactone  (ALDACTONE ) 100 MG tablet Take 1 tablet (100 mg total) by  mouth daily.   triamcinolone  lotion (KENALOG ) 0.1 % Apply 1 Application topically daily.   No facility-administered medications prior to visit.

## 2024-07-30 ENCOUNTER — Ambulatory Visit: Payer: Self-pay | Admitting: Family Medicine

## 2024-07-30 DIAGNOSIS — D649 Anemia, unspecified: Secondary | ICD-10-CM

## 2024-07-30 LAB — COMPREHENSIVE METABOLIC PANEL WITH GFR
ALT: 6 IU/L (ref 0–32)
AST: 13 IU/L (ref 0–40)
Albumin: 4.1 g/dL (ref 3.9–4.9)
Alkaline Phosphatase: 64 IU/L (ref 41–116)
BUN/Creatinine Ratio: 16 (ref 9–23)
BUN: 10 mg/dL (ref 6–20)
Bilirubin Total: 0.2 mg/dL (ref 0.0–1.2)
CO2: 23 mmol/L (ref 20–29)
Calcium: 9.2 mg/dL (ref 8.7–10.2)
Chloride: 105 mmol/L (ref 96–106)
Creatinine, Ser: 0.62 mg/dL (ref 0.57–1.00)
Globulin, Total: 2.8 g/dL (ref 1.5–4.5)
Glucose: 86 mg/dL (ref 70–99)
Potassium: 4.8 mmol/L (ref 3.5–5.2)
Sodium: 139 mmol/L (ref 134–144)
Total Protein: 6.9 g/dL (ref 6.0–8.5)
eGFR: 122 mL/min/1.73 (ref >59)

## 2024-07-30 LAB — CBC
Hematocrit: 27.4 % — ABNORMAL LOW (ref 34.0–46.6)
Hemoglobin: 7.4 g/dL — ABNORMAL LOW (ref 11.1–15.9)
MCH: 18.7 pg — ABNORMAL LOW (ref 26.6–33.0)
MCHC: 27 g/dL — ABNORMAL LOW (ref 31.5–35.7)
MCV: 69 fL — ABNORMAL LOW (ref 79–97)
Platelets: 524 x10E3/uL — ABNORMAL HIGH (ref 150–450)
RBC: 3.96 x10E6/uL (ref 3.77–5.28)
RDW: 18.4 % — ABNORMAL HIGH (ref 11.7–15.4)
WBC: 8.8 x10E3/uL (ref 3.4–10.8)

## 2024-07-30 LAB — TSH+FREE T4
Free T4: 1.11 ng/dL (ref 0.82–1.77)
TSH: 0.777 u[IU]/mL (ref 0.450–4.500)

## 2024-07-30 LAB — LIPID PANEL
Chol/HDL Ratio: 4.1 ratio (ref 0.0–4.4)
Cholesterol, Total: 158 mg/dL (ref 100–199)
HDL: 39 mg/dL — ABNORMAL LOW (ref >39)
LDL Chol Calc (NIH): 97 mg/dL (ref 0–99)
Triglycerides: 123 mg/dL (ref 0–149)
VLDL Cholesterol Cal: 22 mg/dL (ref 5–40)

## 2024-07-30 LAB — HEMOGLOBIN A1C
Est. average glucose Bld gHb Est-mCnc: 117 mg/dL
Hgb A1c MFr Bld: 5.7 % — ABNORMAL HIGH (ref 4.8–5.6)

## 2024-07-30 MED ORDER — IRON (FERROUS SULFATE) 325 (65 FE) MG PO TABS: 325.0000 mg | ORAL_TABLET | Freq: Every day | ORAL | 1 refills | Status: AC

## 2024-07-31 ENCOUNTER — Other Ambulatory Visit: Payer: Self-pay | Admitting: Family Medicine

## 2024-07-31 DIAGNOSIS — B9689 Other specified bacterial agents as the cause of diseases classified elsewhere: Secondary | ICD-10-CM

## 2024-07-31 MED ORDER — METRONIDAZOLE 500 MG PO TABS: 500.0000 mg | ORAL_TABLET | Freq: Two times a day (BID) | ORAL | 0 refills | Status: AC

## 2024-08-01 ENCOUNTER — Telehealth (HOSPITAL_COMMUNITY): Payer: Self-pay | Admitting: Psychiatry

## 2024-08-02 ENCOUNTER — Ambulatory Visit: Admitting: Family Medicine

## 2024-08-02 ENCOUNTER — Encounter: Payer: Self-pay | Admitting: Family Medicine

## 2024-08-02 VITALS — BP 109/67 | HR 71 | Temp 98.7°F | Ht 65.0 in | Wt 288.0 lb

## 2024-08-02 DIAGNOSIS — L732 Hidradenitis suppurativa: Secondary | ICD-10-CM

## 2024-08-02 DIAGNOSIS — Z0289 Encounter for other administrative examinations: Secondary | ICD-10-CM | POA: Diagnosis not present

## 2024-08-02 DIAGNOSIS — T3695XA Adverse effect of unspecified systemic antibiotic, initial encounter: Secondary | ICD-10-CM | POA: Diagnosis not present

## 2024-08-02 DIAGNOSIS — B379 Candidiasis, unspecified: Secondary | ICD-10-CM | POA: Insufficient documentation

## 2024-08-02 DIAGNOSIS — K611 Rectal abscess: Secondary | ICD-10-CM

## 2024-08-02 MED ORDER — FLUCONAZOLE 150 MG PO TABS: ORAL_TABLET | ORAL | 0 refills | Status: AC

## 2024-08-02 NOTE — Progress Notes (Signed)
" ° °  Established Patient Office Visit  Subjective   Patient ID: Melanie Townsend, female    DOB: 1991-08-05  Age: 33 y.o. MRN: [id number]  Chief Complaint  Patient presents with   Forms    Presents today for [facility] paperwork. Recurring HS and perirectal abscesses. Has seen derm, has appoitments scheduled with GI and awaiting GYN.        ROS    Objective:     BP 109/67 (BP Location: Left Arm, Patient Position: Sitting, Cuff Size: Large)   Pulse 71   Temp 98.7 F (37.1 C) (Oral)   Ht 5' 5 (1.651 m)   Wt 288 lb (130.6 kg)   LMP 07/27/2024 (Exact Date)   BMI 47.93 kg/m    Physical Exam Vitals and nursing note reviewed.  Constitutional:      Appearance: Normal appearance.  Cardiovascular:     Rate and Rhythm: Normal rate.  Pulmonary:     Effort: Pulmonary effort is normal.     Breath sounds: Normal breath sounds.  Skin:    General: Skin is warm and dry.  Neurological:     General: No focal deficit present.     Mental Status: She is alert. Mental status is at baseline.  Psychiatric:        Mood and Affect: Mood normal.        Behavior: Behavior normal.        Thought Content: Thought content normal.        Judgment: Judgment normal.      No results found for any visits on 08/02/24.    The ASCVD Risk score (Arnett DK, et al., 2019) failed to calculate for the following reasons:   The 2019 ASCVD risk score is only valid for ages 45 to 33    Assessment & Plan:   Problem List Items Addressed This Visit     Hidradenitis axillaris - Primary   Relevant Medications   fluconazole  (DIFLUCAN ) 150 MG tablet   Perirectal abscess   Encounter for completion of form with patient   Forms completed for ongoing care related to chronic conditions.       Antibiotic-induced yeast infection   On Flagyl  for BV Reporting yeast infection. Diflucan  150 mg today, may repeat in 3 days if needed.       Relevant Medications   fluconazole  (DIFLUCAN ) 150 MG tablet   Agrees with plan of care discussed.  Questions answered.   Return if symptoms worsen or fail to improve.    Darice JONELLE Brownie, FNP  "

## 2024-08-02 NOTE — Assessment & Plan Note (Signed)
 Forms completed for ongoing care related to chronic conditions.

## 2024-08-02 NOTE — Assessment & Plan Note (Signed)
 On Flagyl  for BV Reporting yeast infection. Diflucan  150 mg today, may repeat in 3 days if needed.

## 2024-08-05 ENCOUNTER — Encounter: Payer: Self-pay | Admitting: Hematology & Oncology

## 2024-08-12 ENCOUNTER — Ambulatory Visit: Admitting: Family Medicine

## 2024-08-12 ENCOUNTER — Telehealth (HOSPITAL_COMMUNITY): Payer: Self-pay | Admitting: Psychiatry

## 2024-08-14 ENCOUNTER — Ambulatory Visit (HOSPITAL_COMMUNITY): Payer: Self-pay | Admitting: Registered Nurse

## 2024-08-14 ENCOUNTER — Encounter (HOSPITAL_COMMUNITY): Payer: Self-pay

## 2024-08-21 ENCOUNTER — Ambulatory Visit (INDEPENDENT_AMBULATORY_CARE_PROVIDER_SITE_OTHER): Admitting: Family Medicine

## 2024-08-21 ENCOUNTER — Encounter: Payer: Self-pay | Admitting: Family Medicine

## 2024-08-21 VITALS — BP 139/84 | HR 73 | Temp 98.0°F | Ht 65.0 in | Wt 288.0 lb

## 2024-08-21 DIAGNOSIS — K611 Rectal abscess: Secondary | ICD-10-CM

## 2024-08-21 DIAGNOSIS — Z0289 Encounter for other administrative examinations: Secondary | ICD-10-CM | POA: Diagnosis not present

## 2024-08-21 DIAGNOSIS — L732 Hidradenitis suppurativa: Secondary | ICD-10-CM | POA: Diagnosis not present

## 2024-08-21 NOTE — Progress Notes (Signed)
" ° °  Established Patient Office Visit  Subjective   Patient ID: Melanie Townsend, female    DOB: January 17, 1991  Age: 34 y.o. MRN: [id number]  Chief Complaint  Patient presents with   FMLA    Presents today for FMLA completion and work accomodation.       ROS    Objective:     BP 139/84 (BP Location: Left Arm, Patient Position: Sitting, Cuff Size: Large)   Pulse 73   Temp 98 F (36.7 C) (Oral)   Ht 5' 5 (1.651 m)   Wt 288 lb (130.6 kg)   LMP 07/27/2024 (Exact Date)   SpO2 100%   BMI 47.93 kg/m    Physical Exam Vitals and nursing note reviewed.  Constitutional:      General: She is not in acute distress.    Appearance: Normal appearance.  Pulmonary:     Effort: Pulmonary effort is normal.  Skin:    General: Skin is warm and dry.  Neurological:     General: No focal deficit present.     Mental Status: She is alert. Mental status is at baseline.  Psychiatric:        Mood and Affect: Mood normal.        Behavior: Behavior normal.        Thought Content: Thought content normal.        Judgment: Judgment normal.      No results found for any visits on 08/21/24.    The ASCVD Risk score (Arnett DK, et al., 2019) failed to calculate for the following reasons:   The 2019 ASCVD risk score is only valid for ages 92 to 49    Assessment & Plan:   Problem List Items Addressed This Visit     Hidradenitis axillaris   Perirectal abscess - Primary   Encounter for completion of form with patient   FMLA/work accomodation form completed today.      Agrees with plan of care discussed.  Questions answered.   Return if symptoms worsen or fail to improve.    Darice JONELLE Brownie, FNP  "

## 2024-08-21 NOTE — Assessment & Plan Note (Signed)
 FMLA/work accomodation form completed today.

## 2024-08-26 ENCOUNTER — Encounter: Payer: Self-pay | Admitting: Physician Assistant

## 2024-08-26 ENCOUNTER — Ambulatory Visit: Admitting: Physician Assistant

## 2024-08-26 VITALS — BP 110/72 | HR 82 | Ht 66.0 in | Wt 292.1 lb

## 2024-08-26 DIAGNOSIS — K610 Anal abscess: Secondary | ICD-10-CM

## 2024-08-26 DIAGNOSIS — R1310 Dysphagia, unspecified: Secondary | ICD-10-CM

## 2024-08-26 DIAGNOSIS — N920 Excessive and frequent menstruation with regular cycle: Secondary | ICD-10-CM

## 2024-08-26 DIAGNOSIS — D509 Iron deficiency anemia, unspecified: Secondary | ICD-10-CM

## 2024-08-26 DIAGNOSIS — Z8379 Family history of other diseases of the digestive system: Secondary | ICD-10-CM | POA: Diagnosis not present

## 2024-08-26 DIAGNOSIS — D649 Anemia, unspecified: Secondary | ICD-10-CM

## 2024-08-26 DIAGNOSIS — N921 Excessive and frequent menstruation with irregular cycle: Secondary | ICD-10-CM

## 2024-08-26 MED ORDER — NA SULFATE-K SULFATE-MG SULF 17.5-3.13-1.6 GM/177ML PO SOLN: 1.0000 | Freq: Once | ORAL | 0 refills | Status: AC

## 2024-08-26 NOTE — Patient Instructions (Addendum)
 Your provider has requested that you go to the basement level for lab work before leaving today. Press B on the elevator. The lab is located at the first door on the left as you exit the elevator.  You have been scheduled for an Endoscopy and Colonoscopy. Please follow the written instructions given to you at your visit today.  If you use inhalers (even only as needed), please bring them with you on the day of your procedure.  DO NOT TAKE 7 DAYS PRIOR TO TEST- Trulicity (dulaglutide) Ozempic, Wegovy (semaglutide) Mounjaro (tirzepatide) Bydureon Bcise (exanatide extended release)  DO NOT TAKE 1 DAY PRIOR TO YOUR TEST Rybelsus (semaglutide) Adlyxin (lixisenatide) Victoza (liraglutide) Byetta (exanatide) ___________________________________________________________________________  Please follow up sooner if symptoms increase or worsen __________________________________________________________________________  Due to recent changes in healthcare laws, you may see the results of your imaging and laboratory studies on MyChart before your provider has had a chance to review them.  We understand that in some cases there may be results that are confusing or concerning to you. Not all laboratory results come back in the same time frame and the provider may be waiting for multiple results in order to interpret others.  Please give us  48 hours in order for your provider to thoroughly review all the results before contacting the office for clarification of your results.   Thank you for trusting me with your gastrointestinal care!   Ellouise Console, PA-C _______________________________________________________  If your blood pressure at your visit was 140/90 or greater, please contact your primary care physician to follow up on this.  _______________________________________________________  If you are age 37 or older, your body mass index should be between 23-30. Your Body mass index is 47.15 kg/m.  If this is out of the aforementioned range listed, please consider follow up with your Primary Care Provider.  If you are age 42 or younger, your body mass index should be between 19-25. Your Body mass index is 47.15 kg/m. If this is out of the aformentioned range listed, please consider follow up with your Primary Care Provider.   ________________________________________________________  The  GI providers would like to encourage you to use MYCHART to communicate with providers for non-urgent requests or questions.  Due to long hold times on the telephone, sending your provider a message by [facility] may be a faster and more efficient way to get a response.  Please allow 48 business hours for a response.  Please remember that this is for non-urgent requests.  _______________________________________________________

## 2024-08-26 NOTE — Progress Notes (Signed)
 "     Melanie Console, PA-C [address] [place], [place]  [address] Phone: [phone]   Gastroenterology Consultation  Referring Provider:     Booker Darice SAUNDERS, FNP Primary Care Physician:  Booker Darice SAUNDERS, FNP Primary Gastroenterologist:  Melanie Console, PA-C / Glendia Holt, MD  Reason for Consultation:     IBS, anemia, perirectal abscesses        HPI:   Discussed the use of AI scribe software for clinical note transcription with the patient, who gave verbal consent to proceed.  New patient, here to evaluate IBS with constipation.  Also evaluate severe microcytic anemia.  History of recurrent perianal abscesses (since at least 2019).  Pelvic CT in 2019 showed Small posterior perianal collection consistent with a perianal abscess with adjacent inflammation. The abscess and inflammation lies inferior to the levator sling.  07/29/2024 labs significant for severe microcytic anemia with hemoglobin 7.4, hematocrit 27, MCV 69, platelet 524.  She was referred to hematology.  CMP normal.    She saw her PCP 08/21/2024 for follow-up of hidradenitis suppurativa, perirectal abscesses, and to extend FMLA..  Patient admits to pica.  She eats a lot of ice.  She has loose stools which comes and goes.  She admits to menorrhagia and is in between OB/GYN.  She denies weight loss.  Admits to weight gain.  Denies abdominal pain or heartburn.  Has occasional solid food dysphagia when eating rice and eggs.  Loose stools come and go.  She denies hard stools or straining.  Her father had Crohn's disease.  No previous colonoscopy.  Component Ref Range & Units (hover) 4 wk ago (07/29/24) 6 yr ago (07/22/18) 6 yr ago (07/16/18) 6 yr ago (09/13/17)  WBC 8.8 27.0 High  R 13.6 High  R 8.8 R  RBC 3.96 4.22 R 4.55 R 4.36 R  Hemoglobin 7.4 Low  11.2 Low  R 12.5 R 12.0 R  Hematocrit 27.4 Low  37.1 R 40.1 R 36.7 R  MCV 69 Low  87.9 R 88.1 R 84.1 R  MCH 18.7 Low  26.5 R 27.5 R   MCHC 27.0 Low  30.2 R 31.2 R 32.7 R   RDW 18.4 High  13.4 R 13.7 R 15.7 High  R  Platelets 524 High  440 High  R 353 R 323.0     Past Medical History:  Diagnosis Date   Headache associated with hormonal factors    Hidradenitis suppurativa of right axilla 04/2018   PMDD (premenstrual dysphoric disorder)     Past Surgical History:  Procedure Laterality Date   HYDRADENITIS EXCISION Left 02/26/2018   Procedure: EXCISION HIDRADENITIS TO THE LEFT AXILLA;  Surgeon: Marcus Lung, MD;  Location: [facility];  Service: Plastics;  Laterality: Left;   HYDRADENITIS EXCISION Right 04/30/2018   Procedure: EXCISION HIDRADENITIS RIGHT AXILLA;  Surgeon: Marcus Lung, MD;  Location: [facility];  Service: Plastics;  Laterality: Right;   PILONIDAL CYST EXCISION     TONSILLECTOMY AND ADENOIDECTOMY Bilateral 03/11/2019   Procedure: TONSILLECTOMY AND ADENOIDECTOMY;  Surgeon: Karis Clunes, MD;  Location: [facility];  Service: ENT;  Laterality: Bilateral;    Prior to Admission medications  Medication Sig Start Date End Date Taking? Authorizing Provider  clindamycin  (CLEOCIN -T) 1 % lotion Apply topically 2 (two) times daily. 07/03/24   Booker Darice SAUNDERS, FNP  clobetasol  (TEMOVATE ) 0.05 % external solution Apply 1 Application topically 2 (two) times daily. 07/15/24   Alm Delon SAILOR, DO  doxycycline  (VIBRA -TABS) 100 MG tablet Take 1 tablet (100 mg total) by mouth 2 (two) times daily as needed. TAKE WITH FOOD. (For flares) 07/15/24 08/26/24  Alm Delon SAILOR, DO  fluconazole  (DIFLUCAN ) 150 MG tablet Take one tablet by mouth today, may repeat in 3 days if symptoms persist. 08/02/24   Booker Darice SAUNDERS, FNP  Iron , Ferrous Sulfate , 325 (65 Fe) MG TABS Take 325 mg by mouth daily. 07/30/24   Booker Darice SAUNDERS, FNP  mupirocin  ointment (BACTROBAN ) 2 % Apply 1 Application topically 2 (two) times daily. Use until clear for flares. 07/15/24   Alm Delon SAILOR, DO  PRESCRIPTION MEDICATION Take 600 mg by mouth See admin  instructions. Boric Acid 600 mg capsules - insert one capsule (600 mg) vaginally daily at bedtime for 3 days following end of menstrual cycle    [provider]  PROBIOTIC PRODUCT PO Take by mouth.    [provider]  spironolactone  (ALDACTONE ) 100 MG tablet Take 1 tablet (100 mg total) by mouth daily. 07/15/24   Alm Delon SAILOR, DO  triamcinolone  lotion (KENALOG ) 0.1 % Apply 1 Application topically daily. 07/08/24   Booker Darice SAUNDERS, FNP    Family History  Problem Relation Age of Onset   Crohn's disease Father    Diabetes Maternal Uncle    Diabetes Maternal Grandmother    Diabetes Paternal Grandmother      Social History[1]  Allergies as of 08/26/2024 - Review Complete 08/26/2024  Allergen Reaction Noted   Iodine Hives 02/12/2015   Shellfish allergy Hives 04/23/2018    Review of Systems:    All systems reviewed and negative except where noted in HPI.   Physical Exam:  BP 110/72   Pulse 82   Ht 5' 6 (1.676 m)   Wt 292 lb 2 oz (132.5 kg)   LMP 07/27/2024 (Exact Date)   BMI 47.15 kg/m  Patient's last menstrual period was 07/27/2024 (exact date).  General:   Alert,  Well-developed, well-nourished, morbidly obese, pleasant and cooperative in NAD Lungs:  Respirations even and unlabored.  Clear throughout to auscultation.   No wheezes, crackles, or rhonchi. No acute distress. Heart:  Regular rate and rhythm; no murmurs, clicks, rubs, or gallops. Abdomen:  Normal bowel sounds.  No bruits.  Soft, and non-distended without masses, hepatosplenomegaly or hernias noted.  No Tenderness.  No guarding or rebound tenderness.    Neurologic:  Alert and oriented x3;  grossly normal neurologically. Psych:  Alert and cooperative. Normal mood and affect.   Imaging Studies: No results found.  Labs: CBC    Component Value Date/Time   WBC 8.8 07/29/2024 1340   WBC 27.0 (H) 07/22/2018 2048   RBC 3.96 07/29/2024 1340   RBC 4.22 07/22/2018 2048   HGB 7.4 (L) 07/29/2024  1340   HCT 27.4 (L) 07/29/2024 1340   PLT 524 (H) 07/29/2024 1340   MCV 69 (L) 07/29/2024 1340    CMP     Component Value Date/Time   NA 139 07/29/2024 1340   K 4.8 07/29/2024 1340   CL 105 07/29/2024 1340   CO2 23 07/29/2024 1340   GLUCOSE 86 07/29/2024 1340   GLUCOSE 110 (H) 07/22/2018 2048   BUN 10 07/29/2024 1340   CREATININE 0.62 07/29/2024 1340   CALCIUM 9.2 07/29/2024 1340   PROT 6.9 07/29/2024 1340   ALBUMIN 4.1 07/29/2024 1340   AST 13 07/29/2024 1340   ALT 6 07/29/2024 1340   ALKPHOS 64 07/29/2024 1340   BILITOT 0.2 07/29/2024 1340  GFRNONAA >60 07/22/2018 2048   GFRAA >60 07/22/2018 2048    Assessment and Plan:   Khiya Friese is a 34 y.o. y/o female has been referred for   1.  Severe microcytic anemia.  Iron  deficiency anemia possibly related to menorrhagia versus IBD. - Labs: CBC, iron  panel, ferritin, B12, folate, and celiac lab.  - Continue follow-up with hematology.  Scheduled for IV iron  09/03/2024. - Could not tolerate oral iron . - Schedule EGD and colonoscopy to evaluate iron  deficiency anemia. - Advised her to follow-up with GYN for menorrhagia.  2.  Recurrent perianal abscesses since at least 2019; rule out inflammatory bowel disease.  Her father had Crohn's disease. - Fecal calprotectin, CRP - Scheduling Colonoscopy I discussed risks of colonoscopy and EGD with patient to include risk of bleeding, colon perforation, and risk of sedation.  Patient expressed understanding and agrees to proceed with colonoscopy.   3.  Solid food dysphagia - EGD with possible esophageal dilation   Follow up 4 weeks after EGD and colonoscopy with TG.  Melanie Console, PA-C       [1]  Social History Tobacco Use   Smoking status: Some Days    Current packs/day: 0.33    Average packs/day: 0.3 packs/day for 5.0 years (1.7 ttl pk-yrs)    Types: Cigarettes   Smokeless tobacco: Never  Vaping Use   Vaping status: Never Used  Substance Use Topics    Alcohol use: Yes    Comment: occasionally   Drug use: No   "

## 2024-09-01 NOTE — Progress Notes (Signed)
 Agree with the assessment and plan as outlined by Brigitte Canard, PA-C.

## 2024-09-02 ENCOUNTER — Other Ambulatory Visit: Payer: Self-pay | Admitting: Family

## 2024-09-02 ENCOUNTER — Telehealth: Payer: Self-pay

## 2024-09-02 DIAGNOSIS — D649 Anemia, unspecified: Secondary | ICD-10-CM

## 2024-09-02 NOTE — Telephone Encounter (Signed)
 Copied from CRM [id number]. Topic: General - Other >> Aug 30, 2024 11:29 AM Hadassah PARAS wrote: Reason for CRM: Short term disability paperwork. [employer] sent over paperwork to be filled on 1/14. Please call pt confirm paperwork has been completed #[phone]

## 2024-09-02 NOTE — Telephone Encounter (Signed)
 Forms have been faxed with confirmation. MyChart message sent to patient.

## 2024-09-03 ENCOUNTER — Encounter: Payer: Self-pay | Admitting: Family

## 2024-09-03 ENCOUNTER — Inpatient Hospital Stay: Attending: Hematology & Oncology

## 2024-09-03 ENCOUNTER — Inpatient Hospital Stay: Admitting: Family

## 2024-09-03 VITALS — BP 129/86 | HR 78 | Temp 98.5°F | Resp 19 | Ht 65.0 in | Wt 290.0 lb

## 2024-09-03 DIAGNOSIS — F3281 Premenstrual dysphoric disorder: Secondary | ICD-10-CM | POA: Diagnosis not present

## 2024-09-03 DIAGNOSIS — Z9089 Acquired absence of other organs: Secondary | ICD-10-CM | POA: Insufficient documentation

## 2024-09-03 DIAGNOSIS — Z833 Family history of diabetes mellitus: Secondary | ICD-10-CM | POA: Insufficient documentation

## 2024-09-03 DIAGNOSIS — Z8379 Family history of other diseases of the digestive system: Secondary | ICD-10-CM

## 2024-09-03 DIAGNOSIS — D500 Iron deficiency anemia secondary to blood loss (chronic): Secondary | ICD-10-CM | POA: Insufficient documentation

## 2024-09-03 DIAGNOSIS — K589 Irritable bowel syndrome without diarrhea: Secondary | ICD-10-CM | POA: Insufficient documentation

## 2024-09-03 DIAGNOSIS — N920 Excessive and frequent menstruation with regular cycle: Secondary | ICD-10-CM | POA: Diagnosis not present

## 2024-09-03 DIAGNOSIS — R531 Weakness: Secondary | ICD-10-CM | POA: Insufficient documentation

## 2024-09-03 DIAGNOSIS — R6883 Chills (without fever): Secondary | ICD-10-CM | POA: Diagnosis not present

## 2024-09-03 DIAGNOSIS — D259 Leiomyoma of uterus, unspecified: Secondary | ICD-10-CM | POA: Insufficient documentation

## 2024-09-03 DIAGNOSIS — Z888 Allergy status to other drugs, medicaments and biological substances status: Secondary | ICD-10-CM | POA: Insufficient documentation

## 2024-09-03 DIAGNOSIS — R5383 Other fatigue: Secondary | ICD-10-CM

## 2024-09-03 DIAGNOSIS — Z79899 Other long term (current) drug therapy: Secondary | ICD-10-CM

## 2024-09-03 DIAGNOSIS — D649 Anemia, unspecified: Secondary | ICD-10-CM

## 2024-09-03 DIAGNOSIS — R202 Paresthesia of skin: Secondary | ICD-10-CM | POA: Diagnosis not present

## 2024-09-03 DIAGNOSIS — R0602 Shortness of breath: Secondary | ICD-10-CM | POA: Insufficient documentation

## 2024-09-03 DIAGNOSIS — R42 Dizziness and giddiness: Secondary | ICD-10-CM | POA: Diagnosis not present

## 2024-09-03 DIAGNOSIS — F17210 Nicotine dependence, cigarettes, uncomplicated: Secondary | ICD-10-CM | POA: Insufficient documentation

## 2024-09-03 DIAGNOSIS — R200 Anesthesia of skin: Secondary | ICD-10-CM | POA: Diagnosis not present

## 2024-09-03 DIAGNOSIS — R002 Palpitations: Secondary | ICD-10-CM | POA: Diagnosis not present

## 2024-09-03 LAB — RETICULOCYTES
Immature Retic Fract: 38.7 % — ABNORMAL HIGH (ref 2.3–15.9)
RBC.: 4.04 MIL/uL (ref 3.87–5.11)
Retic Count, Absolute: 92.1 K/uL (ref 19.0–186.0)
Retic Ct Pct: 2.3 % (ref 0.4–3.1)

## 2024-09-03 LAB — CMP (CANCER CENTER ONLY)
ALT: 14 U/L (ref 0–44)
AST: 16 U/L (ref 15–41)
Albumin: 4 g/dL (ref 3.5–5.0)
Alkaline Phosphatase: 64 U/L (ref 38–126)
Anion gap: 9 (ref 5–15)
BUN: 12 mg/dL (ref 6–20)
CO2: 24 mmol/L (ref 22–32)
Calcium: 9.2 mg/dL (ref 8.9–10.3)
Chloride: 104 mmol/L (ref 98–111)
Creatinine: 0.62 mg/dL (ref 0.44–1.00)
GFR, Estimated: >60 mL/min
Glucose, Bld: 122 mg/dL — ABNORMAL HIGH (ref 70–99)
Potassium: 4.1 mmol/L (ref 3.5–5.1)
Sodium: 138 mmol/L (ref 135–145)
Total Bilirubin: 0.2 mg/dL (ref 0.0–1.2)
Total Protein: 7.3 g/dL (ref 6.5–8.1)

## 2024-09-03 LAB — CBC WITH DIFFERENTIAL (CANCER CENTER ONLY)
Abs Immature Granulocytes: 0.04 K/uL (ref 0.00–0.07)
Basophils Absolute: 0.1 K/uL (ref 0.0–0.1)
Basophils Relative: 1 %
Eosinophils Absolute: 0.3 K/uL (ref 0.0–0.5)
Eosinophils Relative: 3 %
HCT: 25.9 % — ABNORMAL LOW (ref 36.0–46.0)
Hemoglobin: 7.2 g/dL — ABNORMAL LOW (ref 12.0–15.0)
Immature Granulocytes: 0 %
Lymphocytes Relative: 24 %
Lymphs Abs: 2.3 K/uL (ref 0.7–4.0)
MCH: 18 pg — ABNORMAL LOW (ref 26.0–34.0)
MCHC: 27.8 g/dL — ABNORMAL LOW (ref 30.0–36.0)
MCV: 64.9 fL — ABNORMAL LOW (ref 80.0–100.0)
Monocytes Absolute: 0.6 K/uL (ref 0.1–1.0)
Monocytes Relative: 6 %
Neutro Abs: 6.4 K/uL (ref 1.7–7.7)
Neutrophils Relative %: 66 %
Platelet Count: 517 K/uL — ABNORMAL HIGH (ref 150–400)
RBC: 3.99 MIL/uL (ref 3.87–5.11)
RDW: 20.3 % — ABNORMAL HIGH (ref 11.5–15.5)
WBC Count: 9.7 K/uL (ref 4.0–10.5)
nRBC: 0.4 % — ABNORMAL HIGH (ref 0.0–0.2)

## 2024-09-03 LAB — IRON AND IRON BINDING CAPACITY (CC-WL,HP ONLY)
Iron: 12 ug/dL — ABNORMAL LOW (ref 28–170)
Saturation Ratios: 2 % — ABNORMAL LOW (ref 10.4–31.8)
TIBC: 581 ug/dL — ABNORMAL HIGH (ref 250–450)
UIBC: 569 ug/dL

## 2024-09-03 LAB — FERRITIN: Ferritin: 4 ng/mL — ABNORMAL LOW (ref 11–307)

## 2024-09-03 LAB — LACTATE DEHYDROGENASE: LDH: 172 U/L (ref 105–235)

## 2024-09-03 MED ORDER — FOLIC ACID 1 MG PO TABS: 1.0000 mg | ORAL_TABLET | Freq: Every day | ORAL | 11 refills | Status: AC

## 2024-09-03 NOTE — Progress Notes (Signed)
 Hematology/Oncology Consultation   Name: Melanie Townsend      MRN: [id number]    Location: Room/bed info not found  Date: 09/03/2024 Time:9:00 AM   REFERRING PHYSICIAN:  Ellouise Console, PA-C  REASON FOR CONSULT:  Iron  deficiency anemia    DIAGNOSIS: Iron  deficiency anemia secondary to menorrhagia vs IBD  HISTORY OF PRESENT ILLNESS:  Melanie Townsend is a pleasant 34 yo African American female with iron  deficiency anemia.  She has tried taking oral iron  without reaping any benefit and suffering in increased GI upset. She's never received IV iron  before.  She is symptomatic with fatigue, weakness, dizziness, brain fog, SOB with exertion, palpitations, chills, craving ice, numbness and tingling that comes and goes in her arms and legs as well as puffiness in her ankles without pitting edema.  She has a very heavy irregular cycle that occurs sporadically throughout the month and lasts over a week. She notes clots.  She states that she has uterine fibroids.  No history of miscarriage.  No other obvious blood loss noted. No bruising or petechiae.  She has been referred to GI for EGD with possible esophageal dilation for dysphagia as well as colonoscopy to assess her IBD. She is scheduled for 09/25/2024.  No known familial history of anemia.  She has had excision in both axilla in the past for hydradenitis as well as I&D of peri-rectal abscesses.  She states that she has had some rectal discomfort and feels she may have a hemorrhoid.  No history of diabetes or thyroid  disease.  No personal history of cancer. Her father and maternal grandmother both had unknown primaries.  No fever, chills, n/v, cough, chest pain, abdominal pain or changes in bowel or bladder habits.  No falls or syncope reported.  Appetite comes and goes. Hydration is good. Weight is stable at 290 lbs.  She had quit smoking but with all the stress with her health she started back at 1 pack per week. ETOH rare socially and no  recreational drug use.  She is currently on medical leave from work.   ROS: All other 10 point review of systems is negative.   PAST MEDICAL HISTORY:   Past Medical History:  Diagnosis Date   Headache associated with hormonal factors    Hidradenitis suppurativa of right axilla 04/2018   PMDD (premenstrual dysphoric disorder)     ALLERGIES: Allergies[1]    MEDICATIONS:  Medications Ordered Prior to Encounter[2]   PAST SURGICAL HISTORY Past Surgical History:  Procedure Laterality Date   HYDRADENITIS EXCISION Left 02/26/2018   Procedure: EXCISION HIDRADENITIS TO THE LEFT AXILLA;  Surgeon: Marcus Lung, MD;  Location: [facility];  Service: Plastics;  Laterality: Left;   HYDRADENITIS EXCISION Right 04/30/2018   Procedure: EXCISION HIDRADENITIS RIGHT AXILLA;  Surgeon: Marcus Lung, MD;  Location: [facility];  Service: Plastics;  Laterality: Right;   PILONIDAL CYST EXCISION     TONSILLECTOMY AND ADENOIDECTOMY Bilateral 03/11/2019   Procedure: TONSILLECTOMY AND ADENOIDECTOMY;  Surgeon: Karis Clunes, MD;  Location: [facility];  Service: ENT;  Laterality: Bilateral;    FAMILY HISTORY: Family History  Problem Relation Age of Onset   Crohn's disease Father    Diabetes Maternal Uncle    Diabetes Maternal Grandmother    Diabetes Paternal Grandmother     SOCIAL HISTORY:  reports that she has been smoking cigarettes. She has a 1.7 pack-year smoking history. She has never used smokeless tobacco. She reports current alcohol use. She reports that she  does not use drugs.  PERFORMANCE STATUS: The patient's performance status is 1 - Symptomatic but completely ambulatory  PHYSICAL EXAM: Most Recent Vital Signs: There were no vitals taken for this visit. BP 129/86 (BP Location: Right Arm, Patient Position: Sitting)   Pulse 78   Temp 98.5 F (36.9 C) (Oral)   Resp 19   Ht 5' 5 (1.651 m)   Wt 290 lb 0.6 oz (131.6 kg)   BMI 48.27 kg/m    General Appearance:    Alert, cooperative, no distress, appears stated age  Head:    Normocephalic, without obvious abnormality, atraumatic  Eyes:    PERRL, conjunctiva/corneas clear, EOM's intact, fundi    benign, both eyes        Throat:   Lips, mucosa, and tongue normal; teeth and gums normal  Neck:   Supple, symmetrical, trachea midline, no adenopathy;    thyroid :  no enlargement/tenderness/nodules; no carotid   bruit or JVD  Back:     Symmetric, no curvature, ROM normal, no CVA tenderness  Lungs:     Clear to auscultation bilaterally, respirations unlabored  Chest Wall:    No tenderness or deformity   Heart:    Regular rate and rhythm, S1 and S2 normal, no murmur, rub   or gallop     Abdomen:     Soft, non-tender, bowel sounds active all four quadrants,    no masses, no organomegaly        Extremities:   Extremities normal, atraumatic, no cyanosis or edema  Pulses:   2+ and symmetric all extremities  Skin:   Skin color, texture, turgor normal, no rashes or lesions  Lymph nodes:   Cervical, supraclavicular, and axillary nodes normal  Neurologic:   CNII-XII intact, normal strength, sensation and reflexes    throughout    LABORATORY DATA:  No results found for this or any previous visit (from the past 48 hours).    RADIOGRAPHY: No results found.     PATHOLOGY: None  ASSESSMENT/PLAN: Melanie Townsend is a pleasant 34 yo African American female with iron  deficiency anemia secondary to menorrhagia and IBD. We will get her set up for 1 dose of Monoferric.  She'll start folic acid  1 mg PO daily.  Follow-up in 5 weeks.   All questions were answered. The patient knows to call the clinic with any problems, questions or concerns. We can certainly see the patient much sooner if necessary.   Lauraine Pepper, NP           [1]  Allergies Allergen Reactions   Iodine Hives        Shellfish Allergy Hives  [2]  Current Outpatient Medications on File Prior to Visit   Medication Sig Dispense Refill   clindamycin  (CLEOCIN -T) 1 % lotion Apply topically 2 (two) times daily. 120 mL 1   clobetasol  (TEMOVATE ) 0.05 % external solution Apply 1 Application topically 2 (two) times daily. 50 mL 11   fluconazole  (DIFLUCAN ) 150 MG tablet Take one tablet by mouth today, may repeat in 3 days if symptoms persist. (Patient not taking: Reported on 08/26/2024) 2 tablet 0   Iron , Ferrous Sulfate , 325 (65 Fe) MG TABS Take 325 mg by mouth daily. (Patient not taking: Reported on 08/26/2024) 90 tablet 1   mupirocin  ointment (BACTROBAN ) 2 % Apply 1 Application topically 2 (two) times daily. Use until clear for flares. 60 g 11   PRESCRIPTION MEDICATION Take 600 mg by mouth See admin instructions. Boric Acid 600 mg capsules -  insert one capsule (600 mg) vaginally daily at bedtime for 3 days following end of menstrual cycle     PROBIOTIC PRODUCT PO Take by mouth.     spironolactone  (ALDACTONE ) 100 MG tablet Take 1 tablet (100 mg total) by mouth daily. (Patient not taking: Reported on 08/26/2024) 30 tablet 5   triamcinolone  lotion (KENALOG ) 0.1 % Apply 1 Application topically daily. 60 mL 1   No current facility-administered medications on file prior to visit.

## 2024-09-04 LAB — ERYTHROPOIETIN: Erythropoietin: 253.9 m[IU]/mL — ABNORMAL HIGH (ref 2.6–18.5)

## 2024-09-05 LAB — HGB FRACTIONATION CASCADE
Hgb A2: 1.6 % — ABNORMAL LOW (ref 1.8–3.2)
Hgb A: 98.4 % (ref 96.4–98.8)
Hgb F: 0 % (ref 0.0–2.0)
Hgb S: 0 %

## 2024-09-11 LAB — ALPHA-THALASSEMIA ANALYSIS: IMAGE: 0

## 2024-09-12 ENCOUNTER — Inpatient Hospital Stay

## 2024-09-12 VITALS — BP 140/85 | HR 80 | Temp 98.3°F | Resp 17

## 2024-09-12 DIAGNOSIS — D500 Iron deficiency anemia secondary to blood loss (chronic): Secondary | ICD-10-CM

## 2024-09-12 MED ORDER — SODIUM CHLORIDE 0.9 % IV SOLN: INTRAVENOUS | Status: DC

## 2024-09-12 MED ORDER — SODIUM CHLORIDE 0.9 % IV SOLN
1000.0000 mg | Freq: Once | INTRAVENOUS | Status: AC
  Administered 2024-09-12: 1000 mg via INTRAVENOUS
  Filled 2024-09-12: qty 10

## 2024-09-12 NOTE — Patient Instructions (Signed)

## 2024-09-17 ENCOUNTER — Encounter: Payer: Self-pay | Admitting: Family

## 2024-09-23 ENCOUNTER — Ambulatory Visit: Admitting: Family Medicine

## 2024-09-24 ENCOUNTER — Institutional Professional Consult (permissible substitution): Admitting: Nurse Practitioner

## 2024-09-25 ENCOUNTER — Encounter: Admitting: Gastroenterology

## 2024-10-07 ENCOUNTER — Inpatient Hospital Stay: Admitting: Family

## 2024-10-07 ENCOUNTER — Inpatient Hospital Stay

## 2024-10-28 ENCOUNTER — Ambulatory Visit: Admitting: Physician Assistant

## 2024-11-06 ENCOUNTER — Encounter: Admitting: Family Medicine

## 2025-01-21 ENCOUNTER — Ambulatory Visit: Admitting: Dermatology
